# Patient Record
Sex: Female | Born: 1970
Health system: Southern US, Community
[De-identification: ages and names within clinical notes are randomized; demographics above are authoritative.]

## PROBLEM LIST (undated history)

## (undated) DIAGNOSIS — K219 Gastro-esophageal reflux disease without esophagitis: Secondary | ICD-10-CM

## (undated) DIAGNOSIS — D649 Anemia, unspecified: Secondary | ICD-10-CM

## (undated) DIAGNOSIS — E119 Type 2 diabetes mellitus without complications: Secondary | ICD-10-CM

## (undated) DIAGNOSIS — R011 Cardiac murmur, unspecified: Secondary | ICD-10-CM

## (undated) DIAGNOSIS — T7840XA Allergy, unspecified, initial encounter: Secondary | ICD-10-CM

## (undated) DIAGNOSIS — M1990 Unspecified osteoarthritis, unspecified site: Secondary | ICD-10-CM

## (undated) HISTORY — PX: OTHER SURGICAL HISTORY: SHX169

## (undated) HISTORY — PX: WISDOM TOOTH EXTRACTION: SHX21

## (undated) HISTORY — DX: Allergy, unspecified, initial encounter: T78.40XA

## (undated) HISTORY — DX: Anemia, unspecified: D64.9

## (undated) HISTORY — DX: Gastro-esophageal reflux disease without esophagitis: K21.9

## (undated) HISTORY — DX: Unspecified osteoarthritis, unspecified site: M19.90

## (undated) HISTORY — DX: Cardiac murmur, unspecified: R01.1

---

## 1997-08-10 ENCOUNTER — Other Ambulatory Visit: Admission: RE | Admit: 1997-08-10 | Discharge: 1997-08-10 | Payer: Self-pay | Admitting: *Deleted

## 1997-10-08 ENCOUNTER — Ambulatory Visit (HOSPITAL_COMMUNITY): Admission: RE | Admit: 1997-10-08 | Discharge: 1997-10-08 | Payer: Self-pay | Admitting: *Deleted

## 1997-11-05 ENCOUNTER — Ambulatory Visit (HOSPITAL_COMMUNITY): Admission: RE | Admit: 1997-11-05 | Discharge: 1997-11-05 | Payer: Self-pay | Admitting: *Deleted

## 1997-12-07 ENCOUNTER — Ambulatory Visit (HOSPITAL_COMMUNITY): Admission: RE | Admit: 1997-12-07 | Discharge: 1997-12-07 | Payer: Self-pay | Admitting: *Deleted

## 1997-12-22 ENCOUNTER — Inpatient Hospital Stay (HOSPITAL_COMMUNITY): Admission: AD | Admit: 1997-12-22 | Discharge: 1997-12-22 | Payer: Self-pay | Admitting: *Deleted

## 1998-02-26 ENCOUNTER — Encounter (HOSPITAL_COMMUNITY): Admission: RE | Admit: 1998-02-26 | Discharge: 1998-03-11 | Payer: Self-pay | Admitting: *Deleted

## 1998-03-05 ENCOUNTER — Encounter: Payer: Self-pay | Admitting: *Deleted

## 1998-03-09 ENCOUNTER — Inpatient Hospital Stay (HOSPITAL_COMMUNITY): Admission: AD | Admit: 1998-03-09 | Discharge: 1998-03-13 | Payer: Self-pay | Admitting: *Deleted

## 1998-03-10 ENCOUNTER — Encounter: Payer: Self-pay | Admitting: *Deleted

## 2000-05-12 ENCOUNTER — Emergency Department (HOSPITAL_COMMUNITY): Admission: EM | Admit: 2000-05-12 | Discharge: 2000-05-12 | Payer: Self-pay | Admitting: Emergency Medicine

## 2000-07-17 ENCOUNTER — Inpatient Hospital Stay (HOSPITAL_COMMUNITY): Admission: AD | Admit: 2000-07-17 | Discharge: 2000-07-17 | Payer: Self-pay | Admitting: *Deleted

## 2000-07-31 ENCOUNTER — Ambulatory Visit (HOSPITAL_COMMUNITY): Admission: RE | Admit: 2000-07-31 | Discharge: 2000-07-31 | Payer: Self-pay | Admitting: *Deleted

## 2000-07-31 ENCOUNTER — Encounter: Payer: Self-pay | Admitting: *Deleted

## 2000-08-29 ENCOUNTER — Inpatient Hospital Stay (HOSPITAL_COMMUNITY): Admission: AD | Admit: 2000-08-29 | Discharge: 2000-08-29 | Payer: Self-pay | Admitting: Obstetrics and Gynecology

## 2000-08-31 ENCOUNTER — Inpatient Hospital Stay (HOSPITAL_COMMUNITY): Admission: AD | Admit: 2000-08-31 | Discharge: 2000-09-03 | Payer: Self-pay | Admitting: Obstetrics and Gynecology

## 2000-08-31 ENCOUNTER — Encounter (INDEPENDENT_AMBULATORY_CARE_PROVIDER_SITE_OTHER): Payer: Self-pay

## 2000-10-03 ENCOUNTER — Other Ambulatory Visit: Admission: RE | Admit: 2000-10-03 | Discharge: 2000-10-03 | Payer: Self-pay | Admitting: Obstetrics and Gynecology

## 2001-11-07 ENCOUNTER — Emergency Department (HOSPITAL_COMMUNITY): Admission: EM | Admit: 2001-11-07 | Discharge: 2001-11-07 | Payer: Self-pay | Admitting: *Deleted

## 2002-03-31 ENCOUNTER — Other Ambulatory Visit: Admission: RE | Admit: 2002-03-31 | Discharge: 2002-03-31 | Payer: Self-pay | Admitting: Family Medicine

## 2002-08-01 ENCOUNTER — Encounter: Admission: RE | Admit: 2002-08-01 | Discharge: 2002-08-01 | Payer: Self-pay | Admitting: Family Medicine

## 2002-08-01 ENCOUNTER — Encounter: Payer: Self-pay | Admitting: Family Medicine

## 2002-11-24 ENCOUNTER — Emergency Department (HOSPITAL_COMMUNITY): Admission: AD | Admit: 2002-11-24 | Discharge: 2002-11-24 | Payer: Self-pay | Admitting: Family Medicine

## 2003-12-19 ENCOUNTER — Emergency Department (HOSPITAL_COMMUNITY): Admission: EM | Admit: 2003-12-19 | Discharge: 2003-12-19 | Payer: Self-pay | Admitting: Emergency Medicine

## 2005-01-06 ENCOUNTER — Emergency Department (HOSPITAL_COMMUNITY): Admission: EM | Admit: 2005-01-06 | Discharge: 2005-01-06 | Payer: Self-pay | Admitting: Emergency Medicine

## 2005-09-08 ENCOUNTER — Ambulatory Visit: Payer: Self-pay | Admitting: Family Medicine

## 2005-09-15 ENCOUNTER — Ambulatory Visit: Payer: Self-pay | Admitting: *Deleted

## 2005-12-11 ENCOUNTER — Ambulatory Visit: Payer: Self-pay | Admitting: Family Medicine

## 2006-04-11 ENCOUNTER — Ambulatory Visit: Payer: Self-pay | Admitting: Family Medicine

## 2006-07-12 ENCOUNTER — Ambulatory Visit: Payer: Self-pay | Admitting: Family Medicine

## 2006-10-03 ENCOUNTER — Encounter (INDEPENDENT_AMBULATORY_CARE_PROVIDER_SITE_OTHER): Payer: Self-pay | Admitting: *Deleted

## 2007-02-11 ENCOUNTER — Emergency Department (HOSPITAL_COMMUNITY): Admission: EM | Admit: 2007-02-11 | Discharge: 2007-02-11 | Payer: Self-pay | Admitting: Emergency Medicine

## 2007-10-04 ENCOUNTER — Emergency Department (HOSPITAL_COMMUNITY): Admission: EM | Admit: 2007-10-04 | Discharge: 2007-10-04 | Payer: Self-pay | Admitting: Family Medicine

## 2007-11-12 ENCOUNTER — Ambulatory Visit: Payer: Self-pay | Admitting: Family Medicine

## 2007-11-12 LAB — CONVERTED CEMR LAB
ALT: 9 units/L (ref 0–35)
AST: 12 units/L (ref 0–37)
BUN: 9 mg/dL (ref 6–23)
Calcium: 9.1 mg/dL (ref 8.4–10.5)
Creatinine, Ser: 0.58 mg/dL (ref 0.40–1.20)
HDL: 37 mg/dL — ABNORMAL LOW (ref >39)
Total Bilirubin: 0.3 mg/dL (ref 0.3–1.2)
Total CHOL/HDL Ratio: 4.9
VLDL: 32 mg/dL (ref 0–40)
Vit D, 1,25-Dihydroxy: 11 — ABNORMAL LOW (ref 30–89)

## 2008-02-13 ENCOUNTER — Ambulatory Visit: Payer: Self-pay | Admitting: Family Medicine

## 2008-02-20 ENCOUNTER — Ambulatory Visit (HOSPITAL_COMMUNITY): Admission: RE | Admit: 2008-02-20 | Discharge: 2008-02-20 | Payer: Self-pay | Admitting: Family Medicine

## 2008-08-04 ENCOUNTER — Ambulatory Visit: Payer: Self-pay | Admitting: Family Medicine

## 2009-02-10 ENCOUNTER — Ambulatory Visit: Payer: Self-pay | Admitting: Internal Medicine

## 2009-02-10 ENCOUNTER — Encounter (INDEPENDENT_AMBULATORY_CARE_PROVIDER_SITE_OTHER): Payer: Self-pay | Admitting: Family Medicine

## 2009-02-10 LAB — CONVERTED CEMR LAB
HDL: 50 mg/dL (ref >39)
Hgb A1c MFr Bld: 6.2 % — ABNORMAL HIGH (ref 4.6–6.1)
LDL Cholesterol: 121 mg/dL — ABNORMAL HIGH (ref 0–99)
Microalb, Ur: 2.74 mg/dL — ABNORMAL HIGH (ref 0.00–1.89)
Triglycerides: 89 mg/dL (ref <150)
VLDL: 18 mg/dL (ref 0–40)

## 2009-02-11 ENCOUNTER — Ambulatory Visit: Payer: Self-pay | Admitting: Internal Medicine

## 2009-07-20 ENCOUNTER — Emergency Department (HOSPITAL_BASED_OUTPATIENT_CLINIC_OR_DEPARTMENT_OTHER): Admission: EM | Admit: 2009-07-20 | Discharge: 2009-07-20 | Payer: Self-pay | Admitting: Emergency Medicine

## 2010-04-03 LAB — WET PREP, GENITAL: Trich, Wet Prep: NONE SEEN

## 2010-04-03 LAB — URINALYSIS, ROUTINE W REFLEX MICROSCOPIC
Bilirubin Urine: NEGATIVE
Glucose, UA: NEGATIVE mg/dL
Ketones, ur: NEGATIVE mg/dL
Protein, ur: NEGATIVE mg/dL
pH: 5.5 (ref 5.0–8.0)

## 2010-04-03 LAB — HERPES SIMPLEX VIRUS CULTURE: Culture: DETECTED

## 2010-04-03 LAB — GC/CHLAMYDIA PROBE AMP, GENITAL
Chlamydia, DNA Probe: NEGATIVE
GC Probe Amp, Genital: NEGATIVE

## 2010-06-03 NOTE — Op Note (Signed)
[facility]  Patient:    Meredith Nichols, Meredith Nichols                     MRN: [id number] Proc. Date: 08/31/00 Adm. Date:  [id number] Attending:  Wandalee Ferdinand                           Operative Report  PREOPERATIVE DIAGNOSES:       1. Intrauterine pregnancy at [redacted] weeks gestation.                               2. Previous cesarean section.                               3. Desire for attempt at permanent                                  sterilization.                               4. Anemia.  POSTOPERATIVE DIAGNOSES:      1. Intrauterine pregnancy at [redacted] weeks gestation.                               2. Meconium-stained amniotic fluid.  OPERATION:                    1. Repeat low transverse cesarean section.                               2. Bilateral tubal sterilization Barnett Abu).  SURGEON:                      Rudy Jew. Ashley Royalty, M.D.  ASSISTANT:                    Marcelle Overlie, M.D.  ANESTHESIA:                   Spinal.  FINDINGS:                     An 8 pound 5 ounce female, Apgars 8 at 1 minute and 9 at 5 minutes, sent to newborn nursery.  ESTIMATED BLOOD LOSS:         800 cc.  COMPLICATIONS:                None.  PACKS AND DRAINS:             Foley.  Sponge, needle, and instrument reported as correct x 2.  DESCRIPTION OF PROCEDURE:     The patient was taken to the operating room and placed in the sitting supine position.  After adequate spinal anesthesia was administered, she was placed in the dorsosupine position and prepped and draped in the usual manner for abdominal surgery.  Foley catheter was placed. A Pfannenstiel incision was made down to the level of the fascia.  The fascia was nicked with a knife and incised transversely with Mayo scissors.  The underlying rectus muscles were separated from the fascia using  sharp and blunt dissection.  The rectus muscles were separated in the midline exposing the peritoneum which was  elevated by hemostats and entered atraumatically with Metzenbaum scissors.  The incision was extended longitudinally.  The uterus was identified and a bladder flap created by incising the intrauterine serosa and sharply and bluntly dissecting the bladder inferior, held in place with a bladder blade.  The uterus was then entered through a low transverse incision using sharp and blunt dissection.  As the membranes were approached, they were noted to be discolored.  Amniotomy was performed and meconium encountered. The infants head was delivered, and DeLee suction was used to aspirate the oropharynx and nasopharynx.  The remainder of the infant was delivered after a loose nuchal cord was reduced.  The cord was triply clamped, cut, and the infant given to the awaiting pediatrics team.  Arterial cord pH was obtained from an isolated segment.  The ______ cord blood was obtained.  Placenta and membranes were removed in their entirely and submitted to pathology for histologic studies.  The uterus was exteriorized.  The uterus was then closed with two running layers of #1 Vicryl and reinforced with a running locking layer.  The second was a running, intermittently locking, and imbricating layer.  A couple of figure-of-eight sutures were required to obtain hemostasis.  Hemostasis was noted.  Next, attention was turned to the tubal sterilization procedure.  The left fallopian tube was grasped from the distal isthmic to the proximal ampullary segment.  An avascular area was chosen for ligation.  A #1 plain suture was placed around this segment of tube.  A second suture was placed.  The intervening portion of fallopian tube was excised with Metzenbaum scissors and submitted to pathology for histologic studies.  The right fallopian tube was grasped at the distal isthmic to the proximal ampullary portion.  A #1 plain catgut suture was placed on this isolated segment to ligate it.  A second suture was  placed as well.  The intervening portion of fallopian tube was incised with Metzenbaum scissors and submitted to pathology for histologic studies.  Hemostasis was noted bilaterally.  The uterus, tubes, and ovaries were found to be otherwise normal and were returned to the abdominal cavity. Copious irrigation was accomplished.  Hemostasis was noted.  The fascia was then closed with 0 Vicryl in a running fashion.  The skin was closed with staples.  At the conclusion of the procedure, the urine was clear and copious. DD:  08/31/00 TD:  09/01/00 Job: [id number]

## 2010-06-03 NOTE — Discharge Summary (Signed)
[facility]  Patient:    Meredith Nichols, Meredith Nichols Visit Number: [id number] MRN: [id number]          Service Type: OBS Location: 910A [id number] [id number] Attending Physician:  Wandalee Ferdinand Dictated by:   Rudy Jew Ashley Royalty, M.D. Admit Date:  08/31/2000 Discharge Date: 09/03/2000                             Discharge Summary  DISCHARGE DIAGNOSES:          1. Intrauterine pregnancy at term, delivered.                               2. Previous cesarean section.                               3. Desire for attempted permanent surgical                                  sterilization.                               4. Anemia.  OPERATIONS AND SPECIAL PROCEDURES:           1. Repeat low transverse cesarean section.                               2. Bilateral tubal sterilization procedure                                  Barnett Abu).  CONSULTATIONS:                None.  DISCHARGE MEDICATIONS:        1. Tylox one p.o. q.3-4h. p.r.n. pain.                               2. Chromagen one p.o. q.day.  HISTORY AND PHYSICAL:         This is a gravida 3, para 2, at [redacted] weeks gestation with aforementioned history. Hemoglobin on admission was 9.2. The patient was admitted for repeat cesarean section and tubal sterilization procedure.  HOSPITAL COURSE:              The patient was admitted to [facility]. Initial laboratory studies were drawn. On August 31, 2000 she was taken to the operating room and underwent repeat low transverse cesarean section and bilateral tubal sterilization procedure. The procedure was uncomplicated. It yielded an 8-pound 5-ounce female, Apgars 8 at one minute and 9 at five minutes, sent to newborn nursery. The patients postoperative course was complicated by the continued anemia. Hemoglobin on September 01, 2000 was 7.6. The hemoglobin stabilized and began rising on the second postoperative day; hence, the patient was felt to be stable  for discharge on the third postoperative day and was discharged home in satisfactory condition.  ACCESSORY CLINICAL FINDINGS:  Hemoglobin and hematocrit on admission were 9.2 and 28.0. Repeat values obtained on September 01, 2000 were 7.6 and 23.4, respectively. Repeat values obtained on September 02, 2000 were 7.8 and 24.3, respectively. Type and Rh revealed O positive blood. RPR was nonreactive.  DISPOSITION:                  The patient was to return to [facility] in four to six weeks for postpartum evaluation. Dictated by:   Rudy Jew Ashley Royalty, M.D. Attending Physician:  Wandalee Ferdinand DD:  09/20/00 TD:  09/20/00 Job: [id number]

## 2012-09-13 ENCOUNTER — Emergency Department (HOSPITAL_BASED_OUTPATIENT_CLINIC_OR_DEPARTMENT_OTHER)
Admission: EM | Admit: 2012-09-13 | Discharge: 2012-09-13 | Disposition: A | Discharge status: Home / Self Care | Payer: Self-pay | Attending: Emergency Medicine | Admitting: Emergency Medicine

## 2012-09-13 ENCOUNTER — Encounter (HOSPITAL_BASED_OUTPATIENT_CLINIC_OR_DEPARTMENT_OTHER): Payer: Self-pay | Admitting: *Deleted

## 2012-09-13 DIAGNOSIS — R21 Rash and other nonspecific skin eruption: Secondary | ICD-10-CM | POA: Insufficient documentation

## 2012-09-13 DIAGNOSIS — L089 Local infection of the skin and subcutaneous tissue, unspecified: Secondary | ICD-10-CM

## 2012-09-13 DIAGNOSIS — E119 Type 2 diabetes mellitus without complications: Secondary | ICD-10-CM | POA: Insufficient documentation

## 2012-09-13 DIAGNOSIS — I10 Essential (primary) hypertension: Secondary | ICD-10-CM | POA: Insufficient documentation

## 2012-09-13 HISTORY — DX: Type 2 diabetes mellitus without complications: E11.9

## 2012-09-13 MED ORDER — SULFAMETHOXAZOLE-TRIMETHOPRIM 800-160 MG PO TABS: 1.0000 | ORAL_TABLET | Freq: Two times a day (BID) | ORAL | Status: DC

## 2012-09-13 MED ORDER — CEFTRIAXONE SODIUM 1 G IJ SOLR
1.0000 g | Freq: Once | INTRAMUSCULAR | Status: AC
  Administered 2012-09-13: 1 g via INTRAMUSCULAR
  Filled 2012-09-13: qty 10

## 2012-09-13 NOTE — ED Provider Notes (Signed)
CSN: [id number]     Arrival date & time 09/13/12  1828 History   First MD Initiated Contact with Patient 09/13/12 1835     Chief Complaint  Patient presents with  . Cellulitis   (Consider location/radiation/quality/duration/timing/severity/associated sxs/prior Treatment) Patient is a 42 y.o. female presenting with rash. The history is provided by the patient. No language interpreter was used.  Rash Location:  Leg Leg rash location:  R leg Quality: painful, redness and swelling   Pain details:    Timing:  Constant   Progression:  Worsening   Past Medical History  Diagnosis Date  . Diabetes mellitus without complication   . Hypertension    Past Surgical History  Procedure Laterality Date  . C sections     No family history on file. History  Substance Use Topics  . Smoking status: Never Smoker   . Smokeless tobacco: Not on file  . Alcohol Use: No   OB History   Grav Para Term Preterm Abortions TAB SAB Ect Mult Living                 Review of Systems  Skin: Positive for rash and wound.  All other systems reviewed and are negative.    Allergies  Review of patient's allergies indicates no known allergies.  Home Medications  No current outpatient prescriptions on file. BP 130/92  Pulse 116  Temp(Src) 98.2 F (36.8 C) (Oral)  Resp 20  SpO2 100% Physical Exam  Nursing note and vitals reviewed. Constitutional: She is oriented to person, place, and time. She appears well-developed and well-nourished.  HENT:  Head: Normocephalic.  Musculoskeletal: She exhibits tenderness.  Red swollen area right lower leg,  3 small pustules in center  Neurological: She is alert and oriented to person, place, and time. She has normal reflexes.  Skin: There is erythema.  Psychiatric: She has a normal mood and affect.    ED Course  Procedures (including critical care time) Labs Review Labs Reviewed - No data to display Imaging Review No results found.  MDM  No diagnosis  found. Pt given injection of Rocephin,  Rx for Bactrim  Pt counseled on soaking and wound improvement vs increasing infection    Elson Areas, PA-C 09/13/12 1923

## 2012-09-13 NOTE — ED Provider Notes (Signed)
Medical screening examination/treatment/procedure(s) were performed by non-physician practitioner and as supervising physician I was immediately available for consultation/collaboration.   Vester Balthazor, MD 09/13/12 1927 

## 2012-09-13 NOTE — ED Notes (Signed)
Bug bites to the back of her right lower leg. Redness, swelling, hot to touch and pain.

## 2014-02-26 ENCOUNTER — Ambulatory Visit: Payer: Self-pay

## 2017-02-05 ENCOUNTER — Encounter: Payer: Self-pay | Admitting: Nurse Practitioner

## 2017-02-05 ENCOUNTER — Ambulatory Visit: Payer: Self-pay | Attending: Nurse Practitioner | Admitting: Nurse Practitioner

## 2017-02-05 ENCOUNTER — Ambulatory Visit: Payer: Self-pay

## 2017-02-05 VITALS — BP 123/81 | HR 73 | Temp 98.2°F | Ht 67.0 in | Wt 221.4 lb

## 2017-02-05 DIAGNOSIS — Z6830 Body mass index (BMI) 30.0-30.9, adult: Secondary | ICD-10-CM | POA: Insufficient documentation

## 2017-02-05 DIAGNOSIS — M25561 Pain in right knee: Secondary | ICD-10-CM | POA: Insufficient documentation

## 2017-02-05 DIAGNOSIS — G8929 Other chronic pain: Secondary | ICD-10-CM | POA: Insufficient documentation

## 2017-02-05 DIAGNOSIS — E785 Hyperlipidemia, unspecified: Secondary | ICD-10-CM | POA: Insufficient documentation

## 2017-02-05 DIAGNOSIS — E669 Obesity, unspecified: Secondary | ICD-10-CM | POA: Insufficient documentation

## 2017-02-05 DIAGNOSIS — Z8639 Personal history of other endocrine, nutritional and metabolic disease: Secondary | ICD-10-CM | POA: Insufficient documentation

## 2017-02-05 DIAGNOSIS — I10 Essential (primary) hypertension: Secondary | ICD-10-CM | POA: Insufficient documentation

## 2017-02-05 DIAGNOSIS — M7989 Other specified soft tissue disorders: Secondary | ICD-10-CM | POA: Insufficient documentation

## 2017-02-05 DIAGNOSIS — E119 Type 2 diabetes mellitus without complications: Secondary | ICD-10-CM | POA: Insufficient documentation

## 2017-02-05 DIAGNOSIS — Z0000 Encounter for general adult medical examination without abnormal findings: Secondary | ICD-10-CM | POA: Insufficient documentation

## 2017-02-05 MED ORDER — MELOXICAM 7.5 MG PO TABS: 7.5000 mg | ORAL_TABLET | Freq: Every day | ORAL | 1 refills | Status: DC

## 2017-02-05 MED FILL — MELOXICAM 7.5 MG TABLET: 7.5 | 30 days supply | Qty: 30 | Fill #0

## 2017-02-05 NOTE — Patient Instructions (Addendum)

## 2017-02-05 NOTE — Progress Notes (Signed)
Assessment & Plan:  Meredith Nichols was seen today for new patient (initial visit).  Diagnoses and all orders for this visit:  Chronic pain of right knee -     meloxicam (MOBIC) 7.5 MG tablet; Take 1 tablet (7.5 mg total) by mouth daily. -     VITAMIN D 25 Hydroxy (Vit-D Deficiency, Fractures) May alternate with acetaminophen for pain relief Apply ice to affected area for reduction of swelling Elevated extremity as much as possible to reduce swelling Wear a knee brace when mobile   Obesity (BMI 30-39.9) Discussed diet and exercise for person with BMI >25. Instructed: You must burn more calories than you eat. Losing 5 percent of your body weight should be considered a success. In the longer term, losing more than 15 percent of your body weight and staying at this weight is an extremely good result. However, keep in mind that even losing 5 percent of your body weight leads to important health benefits, so try not to get discouraged if you're not able to lose more than this. Will recheck weight in 3-6 months.  Routine adult health maintenance -     CBC -     Basic metabolic panel -     Lipid panel  History of diabetes mellitus, type II -     Hemoglobin A1c -     Microalbumin / creatinine urine ratio    Patient has been counseled on age-appropriate routine health concerns for screening and prevention. These are reviewed and up-to-date. Referrals have been placed accordingly. Immunizations are up-to-date or declined.    Subjective:   Chief Complaint  Patient presents with  . New Patient (Initial Visit)    Patient is here as a new patient and stated her knee keeps on popping out. Patient stated her body aches if she sit too long.    HPI Meredith Nichols 47 y.o. female presents to office today to establish care. She has been receiving primary care through [facility] in [place]. Her last physical exam was within the past year. She is due for mammogram and PAP smear. REFERRAL MADE TO  [facility] for Mammogram.   Diabetes Mellitus She was diagnosed with diabetes about 6 years ago. Took lisinopril and another medication she can not recall for diabetes in the past. She does not remember her A1c. States she was taken off both medications after losing over 70lbs. She has managed to keep the weight off. Will check A1c today.      Hyperlipidemia She was taking a statin in the past and reports she was taken off of this medication as well after her weight loss. Lipid panel pending.   Knee Pain Onset several years ago with worsening over the past year. She feels a "pop" in her knee with stair climbing or flexion/bending of the left knee. Pain lasts several  minutes. Relieved by nothing: goes away on its own. She denies any major trauma or injury to her knee. She has fallen a few times in the past and landed on her left knee but no injury that required follow up. She also endorses intermittent swelling of the knee. She has not taken any medication for the pain or swelling.       Review of Systems  Constitutional: Negative for fever, malaise/fatigue and weight loss.  HENT: Negative.  Negative for nosebleeds.   Eyes: Negative.  Negative for blurred vision, double vision and photophobia.  Respiratory: Negative.  Negative for cough and shortness of breath.  Cardiovascular: Negative.  Negative for chest pain, palpitations and leg swelling.  Gastrointestinal: Negative.  Negative for abdominal pain, constipation, diarrhea, heartburn, nausea and vomiting.  Musculoskeletal: Positive for falls (in the past) and joint pain. Negative for back pain, myalgias and neck pain.  Neurological: Negative.  Negative for dizziness, focal weakness, seizures and headaches.  Endo/Heme/Allergies: Negative for environmental allergies.  Psychiatric/Behavioral: Negative.  Negative for suicidal ideas.    Past Medical History:  Diagnosis Date  . Diabetes mellitus without complication ([place])   . Hypertension      Past Surgical History:  Procedure Laterality Date  . c sections      Family History  Problem Relation Age of Onset  . Hypertension Mother     Social History Reviewed with no changes to be made today.   Outpatient Medications Prior to Visit  Medication Sig Dispense Refill  . sulfamethoxazole-trimethoprim (SEPTRA DS) 800-160 MG per tablet Take 1 tablet by mouth 2 (two) times daily. (Patient not taking: Reported on 02/05/2017) 20 tablet 0   No facility-administered medications prior to visit.     Allergies  Allergen Reactions  . Tomato Flavor [Flavoring Agent]     Lip swell up       Objective:    BP 123/81 (BP Location: Left Arm, Patient Position: Sitting, Cuff Size: Normal)   Pulse 73   Temp 98.2 F (36.8 C) (Oral)   Ht 5\' 7"  (1.702 m)   Wt 221 lb 6.4 oz (100.4 kg)   SpO2 100%   BMI 34.68 kg/m  Wt Readings from Last 3 Encounters:  02/05/17 221 lb 6.4 oz (100.4 kg)    Physical Exam  Constitutional: She is oriented to person, place, and time. She appears well-developed and well-nourished. She is cooperative.  HENT:  Head: Normocephalic and atraumatic.  Eyes: EOM are normal.  Neck: Normal range of motion.  Cardiovascular: Normal rate, regular rhythm and intact distal pulses. Exam reveals no gallop and no friction rub.  Murmur heard. Pulmonary/Chest: Effort normal and breath sounds normal. No tachypnea. No respiratory distress. She has no decreased breath sounds. She has no wheezes. She has no rhonchi. She has no rales. She exhibits no tenderness.  Abdominal: Soft. Bowel sounds are normal.  Musculoskeletal: Normal range of motion. She exhibits no edema.  Neurological: She is alert and oriented to person, place, and time. Coordination normal.  Skin: Skin is warm and dry.  Psychiatric: She has a normal mood and affect. Her behavior is normal. Judgment and thought content normal.  Nursing note and vitals reviewed.      Patient has been counseled extensively  about nutrition and exercise as well as the importance of adherence with medications and regular follow-up. The patient was given clear instructions to go to ER or return to medical center if symptoms don't improve, worsen or new problems develop. The patient verbalized understanding.   Follow-up: Return for Needs appointment with financial representative.. FOLLOW UP FOR PAP  Gildardo Pounds, FNP-BC [facility] and [facility], [place]   02/05/2017, 9:31 AM

## 2017-02-06 LAB — CBC
HEMATOCRIT: 24.2 % — AB (ref 34.0–46.6)
HEMOGLOBIN: 6.2 g/dL — AB (ref 11.1–15.9)
MCH: 17.3 pg — AB (ref 26.6–33.0)
MCHC: 25.6 g/dL — AB (ref 31.5–35.7)
MCV: 67 fL — AB (ref 79–97)
Platelets: 450 10*3/uL — ABNORMAL HIGH (ref 150–379)
RBC: 3.59 x10E6/uL — AB (ref 3.77–5.28)
RDW: 19.6 % — ABNORMAL HIGH (ref 12.3–15.4)
WBC: 5 10*3/uL (ref 3.4–10.8)

## 2017-02-06 LAB — MICROALBUMIN / CREATININE URINE RATIO
Creatinine, Urine: 103.9 mg/dL
MICROALB/CREAT RATIO: 40.9 mg/g{creat} — AB (ref 0.0–30.0)
Microalbumin, Urine: 42.5 ug/mL

## 2017-02-06 LAB — HEMOGLOBIN A1C
ESTIMATED AVERAGE GLUCOSE: 108 mg/dL
HEMOGLOBIN A1C: 5.4 % (ref 4.8–5.6)

## 2017-02-06 LAB — BASIC METABOLIC PANEL
BUN / CREAT RATIO: 14 (ref 9–23)
BUN: 8 mg/dL (ref 6–24)
CO2: 24 mmol/L (ref 20–29)
CREATININE: 0.56 mg/dL — AB (ref 0.57–1.00)
Calcium: 9.2 mg/dL (ref 8.7–10.2)
Chloride: 104 mmol/L (ref 96–106)
GFR, EST AFRICAN AMERICAN: 128 mL/min/{1.73_m2} (ref >59)
GFR, EST NON AFRICAN AMERICAN: 111 mL/min/{1.73_m2} (ref >59)
GLUCOSE: 101 mg/dL — AB (ref 65–99)
Potassium: 4 mmol/L (ref 3.5–5.2)
SODIUM: 139 mmol/L (ref 134–144)

## 2017-02-06 LAB — LIPID PANEL
Chol/HDL Ratio: 2.8 ratio (ref 0.0–4.4)
Cholesterol, Total: 147 mg/dL (ref 100–199)
HDL: 52 mg/dL (ref >39)
LDL CALC: 82 mg/dL (ref 0–99)
Triglycerides: 65 mg/dL (ref 0–149)
VLDL CHOLESTEROL CAL: 13 mg/dL (ref 5–40)

## 2017-02-06 LAB — VITAMIN D 25 HYDROXY (VIT D DEFICIENCY, FRACTURES): VIT D 25 HYDROXY: 14 ng/mL — AB (ref 30.0–100.0)

## 2017-02-07 ENCOUNTER — Telehealth: Payer: Self-pay | Admitting: Nurse Practitioner

## 2017-02-07 ENCOUNTER — Other Ambulatory Visit: Payer: Self-pay | Admitting: Obstetrics and Gynecology

## 2017-02-07 ENCOUNTER — Ambulatory Visit: Payer: Self-pay | Attending: Nurse Practitioner

## 2017-02-07 ENCOUNTER — Other Ambulatory Visit: Payer: Self-pay | Admitting: Nurse Practitioner

## 2017-02-07 DIAGNOSIS — R7989 Other specified abnormal findings of blood chemistry: Secondary | ICD-10-CM

## 2017-02-07 DIAGNOSIS — Z1231 Encounter for screening mammogram for malignant neoplasm of breast: Secondary | ICD-10-CM

## 2017-02-07 NOTE — Telephone Encounter (Signed)
-----   Message from Gildardo Pounds, NP sent at 02/07/2017  8:40 AM EST ----- Please call patient. She has a critically low H/H. I need her to come in to the office for another lab draw to have it repeated as soon as possible. Does she have a history of iron deficiency anemia. I have attempted to call her as well this morning and left a voice mail on her mobile number.

## 2017-02-07 NOTE — Telephone Encounter (Signed)
CMA attempt to call patient to have her come in to get lab draw as soon as possible.   Patient did not answer and left a voicemail for patient to call back.

## 2017-02-07 NOTE — Telephone Encounter (Signed)
Patient came in person to get her blood work done.  Patient stated she do have a history of anemia.

## 2017-02-07 NOTE — Telephone Encounter (Signed)
Patient was informed of lab results. Patient stated she DOES have a history or anemia.

## 2017-02-07 NOTE — Addendum Note (Signed)
Addended by: Octaviano Glow on: 02/07/2017 04:29 PM   Modules accepted: Orders

## 2017-02-07 NOTE — Progress Notes (Signed)
Patient here for lab visit only 

## 2017-02-08 LAB — CBC
HEMATOCRIT: 23.1 % — AB (ref 34.0–46.6)
HEMOGLOBIN: 6.2 g/dL — AB (ref 11.1–15.9)
MCH: 18 pg — AB (ref 26.6–33.0)
MCHC: 26.8 g/dL — AB (ref 31.5–35.7)
MCV: 67 fL — AB (ref 79–97)
Platelets: 426 10*3/uL — ABNORMAL HIGH (ref 150–379)
RBC: 3.45 x10E6/uL — ABNORMAL LOW (ref 3.77–5.28)
RDW: 19.6 % — AB (ref 12.3–15.4)
WBC: 6.3 10*3/uL (ref 3.4–10.8)

## 2017-02-12 ENCOUNTER — Encounter: Payer: Self-pay | Admitting: Nurse Practitioner

## 2017-02-12 ENCOUNTER — Other Ambulatory Visit: Payer: Self-pay | Admitting: Nurse Practitioner

## 2017-02-12 ENCOUNTER — Ambulatory Visit: Payer: Self-pay | Attending: Nurse Practitioner | Admitting: Nurse Practitioner

## 2017-02-12 VITALS — BP 132/81 | HR 91 | Temp 98.2°F | Resp 12 | Ht 67.0 in | Wt 223.0 lb

## 2017-02-12 DIAGNOSIS — Z8249 Family history of ischemic heart disease and other diseases of the circulatory system: Secondary | ICD-10-CM | POA: Insufficient documentation

## 2017-02-12 DIAGNOSIS — E119 Type 2 diabetes mellitus without complications: Secondary | ICD-10-CM | POA: Insufficient documentation

## 2017-02-12 DIAGNOSIS — I10 Essential (primary) hypertension: Secondary | ICD-10-CM | POA: Insufficient documentation

## 2017-02-12 DIAGNOSIS — Z791 Long term (current) use of non-steroidal anti-inflammatories (NSAID): Secondary | ICD-10-CM | POA: Insufficient documentation

## 2017-02-12 DIAGNOSIS — D508 Other iron deficiency anemias: Secondary | ICD-10-CM

## 2017-02-12 DIAGNOSIS — Z124 Encounter for screening for malignant neoplasm of cervix: Secondary | ICD-10-CM | POA: Insufficient documentation

## 2017-02-12 DIAGNOSIS — Z9102 Food additives allergy status: Secondary | ICD-10-CM | POA: Insufficient documentation

## 2017-02-12 DIAGNOSIS — Z9889 Other specified postprocedural states: Secondary | ICD-10-CM | POA: Insufficient documentation

## 2017-02-12 NOTE — Progress Notes (Signed)
Assessment & Plan:  Meredith Nichols was seen today for gynecologic exam.  Diagnoses and all orders for this visit:  Pap smear for cervical cancer screening -     Cytology - PAP -     Cervicovaginal ancillary only    Patient has been counseled on age-appropriate routine health concerns for screening and prevention. These are reviewed and up-to-date. Referrals have been placed accordingly. Immunizations are up-to-date or declined.    Subjective:   Chief Complaint  Patient presents with  . Gynecologic Exam    Patient is here for a pap.    HPI Meredith Nichols 47 y.o. female presents to office today for pap. She had an abnormal pap smear in 2007 with negative biopsy. Pap smears thereafter she reports have been negative.     Review of Systems  Constitutional: Negative.  Negative for chills, fever, malaise/fatigue and weight loss.  Respiratory: Negative.  Negative for cough, sputum production and wheezing.   Cardiovascular: Negative.  Negative for chest pain, palpitations, orthopnea and claudication.  Genitourinary: Negative.  Negative for dysuria, flank pain, frequency, hematuria and urgency.  Neurological: Negative.  Negative for dizziness, tingling, sensory change, focal weakness and headaches.  Psychiatric/Behavioral: Negative.     Past Medical History:  Diagnosis Date  . Diabetes mellitus without complication ([place])   . Hypertension     Past Surgical History:  Procedure Laterality Date  . c sections      Family History  Problem Relation Age of Onset  . Hypertension Mother     Social History Reviewed with no changes to be made today.   Outpatient Medications Prior to Visit  Medication Sig Dispense Refill  . meloxicam (MOBIC) 7.5 MG tablet Take 1 tablet (7.5 mg total) by mouth daily. 30 tablet 1  . sulfamethoxazole-trimethoprim (SEPTRA DS) 800-160 MG per tablet Take 1 tablet by mouth 2 (two) times daily. (Patient not taking: Reported on 02/05/2017) 20 tablet 0   No  facility-administered medications prior to visit.     Allergies  Allergen Reactions  . Tomato Flavor [Flavoring Agent]     Lip swell up       Objective:    BP 132/81 (BP Location: Right Arm, Patient Position: Sitting, Cuff Size: Normal)   Pulse 91   Temp 98.2 F (36.8 C) (Oral)   Resp 12   Ht 5\' 7"  (1.702 m)   Wt 223 lb (101.2 kg)   SpO2 100%   BMI 34.93 kg/m  Wt Readings from Last 3 Encounters:  02/12/17 223 lb (101.2 kg)  02/05/17 221 lb 6.4 oz (100.4 kg)    Physical Exam  Constitutional: She is oriented to person, place, and time. She appears well-developed and well-nourished.  HENT:  Head: Normocephalic.  Cardiovascular: Normal rate, regular rhythm and normal heart sounds.  Pulmonary/Chest: Effort normal and breath sounds normal. No respiratory distress. She has no wheezes. She has no rales. She exhibits no tenderness.  Abdominal: Soft. Bowel sounds are normal.  Genitourinary: No labial fusion. There is no rash, tenderness, lesion or injury on the right labia. There is no rash, tenderness, lesion or injury on the left labia. Uterus is deviated. Uterus is not enlarged, not fixed and not tender. Cervix exhibits discharge. Cervix exhibits no motion tenderness and no friability. Right adnexum displays no mass, no tenderness and no fullness. Left adnexum displays no mass, no tenderness and no fullness. No erythema, tenderness or bleeding in the vagina. No foreign body in the vagina. No signs of injury around  the vagina. Vaginal discharge found.  Neurological: She is alert and oriented to person, place, and time.  Psychiatric: She has a normal mood and affect. Her behavior is normal. Judgment and thought content normal.       Patient has been counseled extensively about nutrition and exercise as well as the importance of adherence with medications and regular follow-up. The patient was given clear instructions to go to ER or return to medical center if symptoms don't improve,  worsen or new problems develop. The patient verbalized understanding.   Follow-up: Return if symptoms worsen or fail to improve.   Gildardo Pounds, FNP-BC [facility] and [place], [place]   02/13/2017, 10:38 PM

## 2017-02-13 ENCOUNTER — Telehealth: Payer: Self-pay

## 2017-02-13 ENCOUNTER — Encounter: Payer: Self-pay | Admitting: Nurse Practitioner

## 2017-02-13 NOTE — Telephone Encounter (Signed)
-----   Message from Gildardo Pounds, NP sent at 02/12/2017  1:05 AM EST ----- Labs still show iron deficiency. Please make sure you schedule with the financial representative so that we can refer you to GYN and gastroenterology for additional work up. I would like for you to take iron tablets (ferrous sulfate) 325 mg every other day for the next 3 months. You can take it with orange juice so that it will decrease and stomach upset and it also helps with absorption. Will order additional labs at your next office visit.

## 2017-02-13 NOTE — Telephone Encounter (Signed)
CMA informed patient regarding lab result and PCP advising in person.     Patient understood.

## 2017-02-14 ENCOUNTER — Encounter: Payer: Self-pay | Admitting: Obstetrics & Gynecology

## 2017-02-14 LAB — CERVICOVAGINAL ANCILLARY ONLY
Bacterial vaginitis: POSITIVE — AB
CHLAMYDIA, DNA PROBE: NEGATIVE
Candida vaginitis: NEGATIVE
Neisseria Gonorrhea: NEGATIVE
TRICH (WINDOWPATH): NEGATIVE

## 2017-02-15 ENCOUNTER — Other Ambulatory Visit: Payer: Self-pay | Admitting: Nurse Practitioner

## 2017-02-15 ENCOUNTER — Telehealth: Payer: Self-pay

## 2017-02-15 LAB — CYTOLOGY - PAP
DIAGNOSIS: NEGATIVE
HPV (WINDOPATH): NOT DETECTED

## 2017-02-15 MED ORDER — METRONIDAZOLE 500 MG PO TABS: 500.0000 mg | ORAL_TABLET | Freq: Two times a day (BID) | ORAL | 0 refills | Status: AC

## 2017-02-15 MED FILL — metroNIDAZOLE 500 MG TABS: 500 | 7 days supply | Qty: 14 | Fill #0

## 2017-02-15 NOTE — Telephone Encounter (Signed)
-----   Message from Gildardo Pounds, NP sent at 02/15/2017  1:52 AM EST ----- Wet prep was positive for Bacterial vaginosis. Flagyl has been sent to the pharmacy

## 2017-02-15 NOTE — Telephone Encounter (Signed)
CMA called patient to inform on lab result.  Patient understood and aware to pick up Rx.

## 2017-02-16 ENCOUNTER — Telehealth: Payer: Self-pay

## 2017-02-16 NOTE — Telephone Encounter (Signed)
-----   Message from Gildardo Pounds, NP sent at 02/15/2017  8:51 PM EST ----- PAP smear was normal. Next PAP due in 2022

## 2017-02-16 NOTE — Telephone Encounter (Signed)
CMA attempt to call patient regarding results.  Patient did not answer and CMA left a VM/callback number.   If patient do call, please let her know:  !!PAP smear was normal. Next PAP due in 2022!!

## 2017-02-20 ENCOUNTER — Encounter: Payer: Self-pay | Admitting: Internal Medicine

## 2017-03-06 ENCOUNTER — Encounter (HOSPITAL_COMMUNITY): Payer: Self-pay

## 2017-03-06 ENCOUNTER — Ambulatory Visit (HOSPITAL_COMMUNITY)
Admission: RE | Admit: 2017-03-06 | Discharge: 2017-03-06 | Disposition: A | Discharge status: Home / Self Care | Payer: Self-pay | Source: Ambulatory Visit | Attending: Obstetrics and Gynecology | Admitting: Obstetrics and Gynecology

## 2017-03-06 ENCOUNTER — Ambulatory Visit
Admission: RE | Admit: 2017-03-06 | Discharge: 2017-03-06 | Disposition: A | Discharge status: Home / Self Care | Payer: No Typology Code available for payment source | Source: Ambulatory Visit | Attending: Obstetrics and Gynecology | Admitting: Obstetrics and Gynecology

## 2017-03-06 VITALS — BP 110/70 | Ht 67.0 in | Wt 222.0 lb

## 2017-03-06 DIAGNOSIS — Z1239 Encounter for other screening for malignant neoplasm of breast: Secondary | ICD-10-CM

## 2017-03-06 DIAGNOSIS — Z1231 Encounter for screening mammogram for malignant neoplasm of breast: Secondary | ICD-10-CM

## 2017-03-06 NOTE — Progress Notes (Signed)
No complaints today.   Pap Smear: Pap smear not completed today. Last Pap smear was 02/12/2017 at [facility] and normal with negative HPV. Per patient has a history of one abnormal Pap smear between 2005-2007 that a colposcopy was completed for follow-up. Patient states she has only had two normal Pap smears since colposcopy and that includes her last Pap smear. Last Pap smear result is in Epic.  Physical exam: Breasts Breasts symmetrical. No skin abnormalities bilateral breasts. No nipple retraction bilateral breasts. No nipple discharge bilateral breasts. No lymphadenopathy. No lumps palpated bilateral breasts. No complaints of pain or tenderness on exam. Referred patient to the [facility] for a screening mammogram. Appointment scheduled for Tuesday, March 06, 2017 at 1620.        Pelvic/Bimanual No Pap smear completed today since last Pap smear and HPV typing was 02/12/2017. Pap smear not indicated per BCCCP guidelines.   Smoking History: Patient has never smoked.  Patient Navigation: Patient education provided. Access to services provided for patient through BCCCP program.   Breast and Cervical Cancer Risk Assessment: Patient has no family history of breast cancer, known genetic mutations, or radiation treatment to the chest before age 77. Patient has a history of cervical dysplasia. Patient has no history of being immunocompromised, or DES exposure in-utero.

## 2017-03-06 NOTE — Patient Instructions (Signed)
Explained breast self awareness with Meredith Nichols. Patient did not need a Pap smear today due to last Pap smear and HPV typing was 02/12/2017. Let her know that her next Pap smear will be due in one year since she has had only two normal Pap smears since she had a colposcopy completed. Referred patient to the [facility] for a screening mammogram. Appointment scheduled for Tuesday, March 06, 2017 at 1620. Patient aware of appointment and will be there. Let [facility] know [facility] will follow up with her within the next couple weeks with results of mammogram by letter or phone. [name] verbalized understanding.  Vitor Overbaugh, Arvil Chaco, RN 3:35 PM

## 2017-03-09 ENCOUNTER — Encounter: Payer: Self-pay | Admitting: Obstetrics & Gynecology

## 2017-03-15 MED FILL — MELOXICAM 7.5 MG TABLET: 7.5 | 30 days supply | Qty: 30 | Fill #1

## 2017-04-13 ENCOUNTER — Telehealth: Payer: Self-pay | Admitting: Gastroenterology

## 2017-04-13 ENCOUNTER — Encounter: Payer: Self-pay | Admitting: Internal Medicine

## 2017-04-13 ENCOUNTER — Ambulatory Visit: Payer: Self-pay | Admitting: Gastroenterology

## 2017-04-13 NOTE — Telephone Encounter (Signed)
Patient was a no show and letter sent  °

## 2017-05-14 ENCOUNTER — Ambulatory Visit: Payer: Self-pay | Admitting: Nurse Practitioner

## 2017-05-16 ENCOUNTER — Ambulatory Visit: Payer: Self-pay | Attending: Nurse Practitioner

## 2017-06-17 ENCOUNTER — Emergency Department (HOSPITAL_COMMUNITY)
Admission: EM | Admit: 2017-06-17 | Discharge: 2017-06-17 | Disposition: A | Discharge status: Home / Self Care | Payer: No Typology Code available for payment source | Attending: Emergency Medicine | Admitting: Emergency Medicine

## 2017-06-17 ENCOUNTER — Encounter (HOSPITAL_COMMUNITY): Payer: Self-pay | Admitting: Emergency Medicine

## 2017-06-17 ENCOUNTER — Emergency Department (HOSPITAL_COMMUNITY): Payer: No Typology Code available for payment source

## 2017-06-17 DIAGNOSIS — Y92410 Unspecified street and highway as the place of occurrence of the external cause: Secondary | ICD-10-CM | POA: Insufficient documentation

## 2017-06-17 DIAGNOSIS — Y998 Other external cause status: Secondary | ICD-10-CM | POA: Insufficient documentation

## 2017-06-17 DIAGNOSIS — Y939 Activity, unspecified: Secondary | ICD-10-CM | POA: Insufficient documentation

## 2017-06-17 DIAGNOSIS — M542 Cervicalgia: Secondary | ICD-10-CM | POA: Diagnosis present

## 2017-06-17 DIAGNOSIS — E119 Type 2 diabetes mellitus without complications: Secondary | ICD-10-CM | POA: Insufficient documentation

## 2017-06-17 DIAGNOSIS — Z79899 Other long term (current) drug therapy: Secondary | ICD-10-CM | POA: Insufficient documentation

## 2017-06-17 DIAGNOSIS — M79605 Pain in left leg: Secondary | ICD-10-CM | POA: Insufficient documentation

## 2017-06-17 DIAGNOSIS — M549 Dorsalgia, unspecified: Secondary | ICD-10-CM | POA: Diagnosis not present

## 2017-06-17 LAB — POC URINE PREG, ED: PREG TEST UR: NEGATIVE

## 2017-06-17 MED ORDER — NAPROXEN 500 MG PO TABS: 500.0000 mg | ORAL_TABLET | Freq: Two times a day (BID) | ORAL | 0 refills | Status: DC

## 2017-06-17 MED ORDER — NAPROXEN 250 MG PO TABS
500.0000 mg | ORAL_TABLET | Freq: Once | ORAL | Status: AC
  Administered 2017-06-17: 500 mg via ORAL
  Filled 2017-06-17: qty 2

## 2017-06-17 MED ORDER — METHOCARBAMOL 500 MG PO TABS: 500.0000 mg | ORAL_TABLET | Freq: Three times a day (TID) | ORAL | 0 refills | Status: DC | PRN
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
## 2017-06-17 NOTE — ED Notes (Signed)
Family at bedside. 
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
## 2017-06-19 MED FILL — METHOCARBAMOL 500 MG TABS: 500 | 10 days supply | Qty: 30 | Fill #0

## 2017-06-19 MED FILL — NAPROXEN 500 MG TABLET: 500 | 15 days supply | Qty: 30 | Fill #0

## 2017-06-29 ENCOUNTER — Ambulatory Visit: Payer: No Typology Code available for payment source | Admitting: Nurse Practitioner

## 2017-07-02 ENCOUNTER — Ambulatory Visit: Payer: Self-pay | Attending: Nurse Practitioner | Admitting: Nurse Practitioner

## 2017-07-02 ENCOUNTER — Encounter: Payer: Self-pay | Admitting: Nurse Practitioner

## 2017-07-02 VITALS — BP 113/74 | HR 82 | Temp 99.2°F | Ht 67.0 in | Wt 230.6 lb

## 2017-07-02 DIAGNOSIS — Z841 Family history of disorders of kidney and ureter: Secondary | ICD-10-CM | POA: Insufficient documentation

## 2017-07-02 DIAGNOSIS — M791 Myalgia, unspecified site: Secondary | ICD-10-CM | POA: Insufficient documentation

## 2017-07-02 DIAGNOSIS — N920 Excessive and frequent menstruation with regular cycle: Secondary | ICD-10-CM | POA: Insufficient documentation

## 2017-07-02 DIAGNOSIS — Z8249 Family history of ischemic heart disease and other diseases of the circulatory system: Secondary | ICD-10-CM | POA: Insufficient documentation

## 2017-07-02 DIAGNOSIS — D500 Iron deficiency anemia secondary to blood loss (chronic): Secondary | ICD-10-CM | POA: Insufficient documentation

## 2017-07-02 DIAGNOSIS — Z833 Family history of diabetes mellitus: Secondary | ICD-10-CM | POA: Insufficient documentation

## 2017-07-02 DIAGNOSIS — T148XXA Other injury of unspecified body region, initial encounter: Secondary | ICD-10-CM

## 2017-07-02 NOTE — Patient Instructions (Signed)
Anemia Anemia is a condition in which you do not have enough red blood cells or hemoglobin. Hemoglobin is a substance in red blood cells that carries oxygen. When you do not have enough red blood cells or hemoglobin (are anemic), your body cannot get enough oxygen and your organs may not work properly. As a result, you may feel very tired or have other problems. What are the causes? Common causes of anemia include:  Excessive bleeding. Anemia can be caused by excessive bleeding inside or outside the body, including bleeding from the intestine or from periods in women.  Poor nutrition.  Long-lasting (chronic) kidney, thyroid, and liver disease.  Bone marrow disorders.  Cancer and treatments for cancer.  HIV (human immunodeficiency virus) and AIDS (acquired immunodeficiency syndrome).  Treatments for HIV and AIDS.  Spleen problems.  Blood disorders.  Infections, medicines, and autoimmune disorders that destroy red blood cells.  What are the signs or symptoms? Symptoms of this condition include:  Minor weakness.  Dizziness.  Headache.  Feeling heartbeats that are irregular or faster than normal (palpitations).  Shortness of breath, especially with exercise.  Paleness.  Cold sensitivity.  Indigestion.  Nausea.  Difficulty sleeping.  Difficulty concentrating.  Symptoms may occur suddenly or develop slowly. If your anemia is mild, you may not have symptoms. How is this diagnosed? This condition is diagnosed based on:  Blood tests.  Your medical history.  A physical exam.  Bone marrow biopsy.  Your health care provider may also check your stool (feces) for blood and may do additional testing to look for the cause of your bleeding. You may also have other tests, including:  Imaging tests, such as a CT scan or MRI.  Endoscopy.  Colonoscopy.  How is this treated? Treatment for this condition depends on the cause. If you continue to lose a lot of blood,  you may need to be treated at a hospital. Treatment may include:  Taking supplements of iron, vitamin B12, or folic acid.  Taking a hormone medicine (erythropoietin) that can help to stimulate red blood cell growth.  Having a blood transfusion. This may be needed if you lose a lot of blood.  Making changes to your diet.  Having surgery to remove your spleen.  Follow these instructions at home:  Take over-the-counter and prescription medicines only as told by your health care provider.  Take supplements only as told by your health care provider.  Follow any diet instructions that you were given.  Keep all follow-up visits as told by your health care provider. This is important. Contact a health care provider if:  You develop new bleeding anywhere in the body. Get help right away if:  You are very weak.  You are short of breath.  You have pain in your abdomen or chest.  You are dizzy or feel faint.  You have trouble concentrating.  You have bloody or black, tarry stools.  You vomit repeatedly or you vomit up blood. Summary  Anemia is a condition in which you do not have enough red blood cells or enough of a substance in your red blood cells that carries oxygen (hemoglobin).  Symptoms may occur suddenly or develop slowly.  If your anemia is mild, you may not have symptoms.  This condition is diagnosed with blood tests as well as a medical history and physical exam. Other tests may be needed.  Treatment for this condition depends on the cause of the anemia. This information is not intended to replace advice   given to you by your health care provider. Make sure you discuss any questions you have with your health care provider. Document Released: 02/10/2004 Document Revised: 02/04/2016 Document Reviewed: 02/04/2016 Elsevier Interactive Patient Education  2018 [employer].  Anemia Anemia is a condition in which you do not have enough red blood cells or hemoglobin.  Hemoglobin is a substance in red blood cells that carries oxygen. When you do not have enough red blood cells or hemoglobin (are anemic), your body cannot get enough oxygen and your organs may not work properly. As a result, you may feel very tired or have other problems. What are the causes? Common causes of anemia include:  Excessive bleeding. Anemia can be caused by excessive bleeding inside or outside the body, including bleeding from the intestine or from periods in women.  Poor nutrition.  Long-lasting (chronic) kidney, thyroid, and liver disease.  Bone marrow disorders.  Cancer and treatments for cancer.  HIV (human immunodeficiency virus) and AIDS (acquired immunodeficiency syndrome).  Treatments for HIV and AIDS.  Spleen problems.  Blood disorders.  Infections, medicines, and autoimmune disorders that destroy red blood cells.  What are the signs or symptoms? Symptoms of this condition include:  Minor weakness.  Dizziness.  Headache.  Feeling heartbeats that are irregular or faster than normal (palpitations).  Shortness of breath, especially with exercise.  Paleness.  Cold sensitivity.  Indigestion.  Nausea.  Difficulty sleeping.  Difficulty concentrating.  Symptoms may occur suddenly or develop slowly. If your anemia is mild, you may not have symptoms. How is this diagnosed? This condition is diagnosed based on:  Blood tests.  Your medical history.  A physical exam.  Bone marrow biopsy.  Your health care provider may also check your stool (feces) for blood and may do additional testing to look for the cause of your bleeding. You may also have other tests, including:  Imaging tests, such as a CT scan or MRI.  Endoscopy.  Colonoscopy.  How is this treated? Treatment for this condition depends on the cause. If you continue to lose a lot of blood, you may need to be treated at a hospital. Treatment may include:  Taking supplements of iron,  vitamin C94, or folic acid.  Taking a hormone medicine (erythropoietin) that can help to stimulate red blood cell growth.  Having a blood transfusion. This may be needed if you lose a lot of blood.  Making changes to your diet.  Having surgery to remove your spleen.  Follow these instructions at home:  Take over-the-counter and prescription medicines only as told by your health care provider.  Take supplements only as told by your health care provider.  Follow any diet instructions that you were given.  Keep all follow-up visits as told by your health care provider. This is important. Contact a health care provider if:  You develop new bleeding anywhere in the body. Get help right away if:  You are very weak.  You are short of breath.  You have pain in your abdomen or chest.  You are dizzy or feel faint.  You have trouble concentrating.  You have bloody or black, tarry stools.  You vomit repeatedly or you vomit up blood. Summary  Anemia is a condition in which you do not have enough red blood cells or enough of a substance in your red blood cells that carries oxygen (hemoglobin).  Symptoms may occur suddenly or develop slowly.  If your anemia is mild, you may not have symptoms.  This  condition is diagnosed with blood tests as well as a medical history and physical exam. Other tests may be needed.  Treatment for this condition depends on the cause of the anemia. This information is not intended to replace advice given to you by your health care provider. Make sure you discuss any questions you have with your health care provider. Document Released: 02/10/2004 Document Revised: 02/04/2016 Document Reviewed: 02/04/2016 Elsevier Interactive Patient Education  [employer].
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
## 2017-07-03 ENCOUNTER — Other Ambulatory Visit: Payer: Self-pay | Admitting: Nurse Practitioner

## 2017-07-03 DIAGNOSIS — N920 Excessive and frequent menstruation with regular cycle: Secondary | ICD-10-CM

## 2017-07-03 LAB — CBC
Hematocrit: 26.2 % — ABNORMAL LOW (ref 34.0–46.6)
Hemoglobin: 7.6 g/dL — ABNORMAL LOW (ref 11.1–15.9)
MCH: 21.1 pg — ABNORMAL LOW (ref 26.6–33.0)
MCHC: 29 g/dL — AB (ref 31.5–35.7)
MCV: 73 fL — ABNORMAL LOW (ref 79–97)
Platelets: 394 10*3/uL (ref 150–450)
RBC: 3.61 x10E6/uL — ABNORMAL LOW (ref 3.77–5.28)
RDW: 17.7 % — AB (ref 12.3–15.4)
WBC: 7 10*3/uL (ref 3.4–10.8)

## 2017-08-15 ENCOUNTER — Telehealth: Payer: Self-pay | Admitting: Nurse Practitioner

## 2017-08-15 NOTE — Telephone Encounter (Signed)
Pt called to request a nurse call back, due to light back pain. Please follow up when possible.did not want an appt

## 2017-08-16 NOTE — Telephone Encounter (Signed)
Will route to PCP.  CMA did attempt to call patient back, no answer, and left a Vm.
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
## 2017-08-27 MED FILL — PENICILLIN VK 500 MG TABLET: 500 | 7 days supply | Qty: 28 | Fill #0

## 2017-08-30 ENCOUNTER — Encounter: Payer: Self-pay | Admitting: Nurse Practitioner

## 2017-08-30 NOTE — Telephone Encounter (Signed)
Patient's letter is in chart. Please let her know she can pick up at her convenience.

## 2017-08-30 NOTE — Telephone Encounter (Signed)
CMA spoke to patient to inform her letter she request is ready for her.  Patient stated she will print it off on MyChart instead of picking it up.

## 2017-08-31 ENCOUNTER — Encounter: Payer: Self-pay | Admitting: Obstetrics & Gynecology

## 2017-10-18 ENCOUNTER — Ambulatory Visit (INDEPENDENT_AMBULATORY_CARE_PROVIDER_SITE_OTHER): Payer: Self-pay | Admitting: Obstetrics & Gynecology

## 2017-10-18 DIAGNOSIS — D649 Anemia, unspecified: Secondary | ICD-10-CM | POA: Insufficient documentation

## 2017-10-18 DIAGNOSIS — N920 Excessive and frequent menstruation with regular cycle: Secondary | ICD-10-CM

## 2017-10-18 DIAGNOSIS — D500 Iron deficiency anemia secondary to blood loss (chronic): Secondary | ICD-10-CM

## 2017-10-18 MED ORDER — FERROUS SULFATE 325 (65 FE) MG PO TABS: 325.0000 mg | ORAL_TABLET | Freq: Every day | ORAL | 3 refills | Status: DC

## 2017-10-18 MED ORDER — NAPROXEN 500 MG PO TABS: 500.0000 mg | ORAL_TABLET | Freq: Two times a day (BID) | ORAL | 3 refills | Status: DC

## 2017-10-18 NOTE — Addendum Note (Signed)
Addended by: Woodroe Mode on: 10/18/2017 05:06 PM   Modules accepted: Orders

## 2017-10-18 NOTE — Progress Notes (Signed)
Patient ID: Meredith Nichols, female   DOB: Sep 27, 1970, 47 y.o.   MRN: [id number]  Chief Complaint  Patient presents with  . Menorrhagia    HPI Meredith Nichols is a 47 y.o. female.  G3P3 Patient's last menstrual period was 10/04/2017. She has regular menses lasting 7 days with 2 days of heavy flow. No cramping HPI  Past Medical History:  Diagnosis Date  . Anemia     Past Surgical History:  Procedure Laterality Date  . c sections    . CESAREAN SECTION     2 previous    Family History  Problem Relation Age of Onset  . Hypertension Mother   . Diabetes Mother   . Kidney disease Mother   . Diabetes Brother     Social History Social History   Tobacco Use  . Smoking status: Never Smoker  . Smokeless tobacco: Never Used  Substance Use Topics  . Alcohol use: No  . Drug use: No    Allergies  Allergen Reactions  . Tomato Flavor [Flavoring Agent]     Lip swell up    Current Outpatient Medications  Medication Sig Dispense Refill  . naproxen (NAPROSYN) 500 MG tablet Take 1 tablet (500 mg total) by mouth 2 (two) times daily with a meal. 60 tablet 3  . penicillin v potassium (VEETID) 500 MG tablet Take 500 mg by mouth every 6 (six) hours.  0   No current facility-administered medications for this visit.     Review of Systems Review of Systems  Constitutional: Negative.   Respiratory: Negative.   Cardiovascular: Negative.   Gastrointestinal: Negative.   Genitourinary: Positive for menstrual problem and vaginal bleeding. Negative for vaginal discharge.    Blood pressure 126/71, pulse 92, weight 236 lb (107 kg), last menstrual period 10/04/2017, unknown if currently breastfeeding.  Physical Exam Physical Exam  Constitutional: She appears well-developed and well-nourished.  Cardiovascular: Normal rate.  Pulmonary/Chest: Effort normal.  Skin: Skin is warm and dry. No pallor.  Psychiatric: She has a normal mood and affect. Her behavior is normal.  Vitals  reviewed.   Data Reviewed [place] 2004 CBC    Component Value Date/Time   WBC 7.0 07/02/2017 1643   RBC 3.61 (L) 07/02/2017 1643   HGB 7.6 (L) 07/02/2017 1643   HCT 26.2 (L) 07/02/2017 1643   PLT 394 07/02/2017 1643   MCV 73 (L) 07/02/2017 1643   MCH 21.1 (L) 07/02/2017 1643   MCHC 29.0 (L) 07/02/2017 1643   RDW 17.7 (H) 07/02/2017 1643     Assessment    Patient Active Problem List   Diagnosis Date Noted  . Menorrhagia with regular cycle 10/18/2017  . Anemia 10/18/2017  . History of diabetes mellitus, type II 02/05/2017       Plan    Naproxen BID during menses.  Pelvic US RTC 3 month Consider Mirena or ablation  Feso4 BID    Emeterio Reeve 10/18/2017, 2:39 PM

## 2017-10-18 NOTE — Patient Instructions (Signed)
Menorrhagia Menorrhagia is when your menstrual periods are heavy or last longer than usual. Follow these instructions at home:  Only take medicine as told by your doctor.  Take any iron pills as told by your doctor. Heavy bleeding may cause low levels of iron in your body.  Do not take aspirin 1 week before or during your period. Aspirin can make the bleeding worse.  Lie down for a while if you change your tampon or pad more than once in 2 hours. This may help lessen the bleeding.  Eat a healthy diet and foods with iron. These foods include leafy green vegetables, meat, liver, eggs, and whole grain breads and cereals.  Do not try to lose weight. Wait until the heavy bleeding has stopped and your iron level is normal. Contact a doctor if:  You soak through a pad or tampon every 1 or 2 hours, and this happens every time you have a period.  You need to use pads and tampons at the same time because you are bleeding so much.  You need to change your pad or tampon during the night.  You have a period that lasts for more than 8 days.  You pass clots bigger than 1 inch (2.5 cm) wide.  You have irregular periods that happen more or less often than once a month.  You feel dizzy or pass out (faint).  You feel very weak or tired.  You feel short of breath or feel your heart is beating too fast when you exercise.  You feel sick to your stomach (nausea) and you throw up (vomit) while you are taking your medicine.  You have watery poop (diarrhea) while you are taking your medicine.  You have any problems that may be related to the medicine you are taking. Get help right away if:  You soak through 4 or more pads or tampons in 2 hours.  You have any bleeding while you are pregnant. This information is not intended to replace advice given to you by your health care provider. Make sure you discuss any questions you have with your health care provider. Document Released: 10/12/2007 Document  Revised: 06/10/2015 Document Reviewed: 07/04/2012 Elsevier Interactive Patient Education  2017 Elsevier Inc.  

## 2017-10-19 LAB — CBC
HEMATOCRIT: 26.4 % — AB (ref 34.0–46.6)
Hemoglobin: 7.3 g/dL — ABNORMAL LOW (ref 11.1–15.9)
MCH: 20.9 pg — ABNORMAL LOW (ref 26.6–33.0)
MCHC: 27.7 g/dL — ABNORMAL LOW (ref 31.5–35.7)
MCV: 75 fL — ABNORMAL LOW (ref 79–97)
Platelets: 389 10*3/uL (ref 150–450)
RBC: 3.5 x10E6/uL — ABNORMAL LOW (ref 3.77–5.28)
RDW: 16.8 % — AB (ref 12.3–15.4)
WBC: 7.2 10*3/uL (ref 3.4–10.8)

## 2017-10-22 ENCOUNTER — Ambulatory Visit (HOSPITAL_COMMUNITY)
Admission: RE | Admit: 2017-10-22 | Discharge: 2017-10-22 | Disposition: A | Discharge status: Home / Self Care | Payer: Self-pay | Source: Ambulatory Visit | Attending: Obstetrics & Gynecology | Admitting: Obstetrics & Gynecology

## 2017-10-22 DIAGNOSIS — N920 Excessive and frequent menstruation with regular cycle: Secondary | ICD-10-CM | POA: Insufficient documentation

## 2017-10-22 DIAGNOSIS — D259 Leiomyoma of uterus, unspecified: Secondary | ICD-10-CM | POA: Insufficient documentation

## 2017-10-31 MED FILL — FERROUS SULFATE 325 MG TAB: 325 (65 FE) | 30 days supply | Qty: 30 | Fill #0

## 2017-10-31 MED FILL — NAPROXEN 500 MG TABLET: 500 | 30 days supply | Qty: 60 | Fill #0

## 2017-11-14 ENCOUNTER — Encounter: Payer: Self-pay | Admitting: Nurse Practitioner

## 2017-11-15 ENCOUNTER — Other Ambulatory Visit: Payer: Self-pay | Admitting: Nurse Practitioner

## 2017-11-15 MED ORDER — METHOCARBAMOL 500 MG PO TABS: 500.0000 mg | ORAL_TABLET | Freq: Three times a day (TID) | ORAL | 1 refills | Status: DC | PRN

## 2017-11-16 MED FILL — METHOCARBAMOL 500 MG TABS: 500 | 10 days supply | Qty: 30 | Fill #0

## 2018-05-23 DIAGNOSIS — R011 Cardiac murmur, unspecified: Secondary | ICD-10-CM | POA: Insufficient documentation

## 2019-02-21 LAB — HM DIABETES EYE EXAM

## 2019-02-25 ENCOUNTER — Encounter: Payer: Self-pay | Admitting: Nurse Practitioner

## 2019-02-25 ENCOUNTER — Ambulatory Visit: Payer: No Typology Code available for payment source | Admitting: Nurse Practitioner

## 2019-03-04 ENCOUNTER — Other Ambulatory Visit: Payer: Self-pay

## 2019-03-04 ENCOUNTER — Ambulatory Visit: Payer: 59 | Attending: Nurse Practitioner | Admitting: Nurse Practitioner

## 2019-03-04 ENCOUNTER — Encounter: Payer: Self-pay | Admitting: Nurse Practitioner

## 2019-03-04 DIAGNOSIS — E559 Vitamin D deficiency, unspecified: Secondary | ICD-10-CM | POA: Diagnosis not present

## 2019-03-04 DIAGNOSIS — R7303 Prediabetes: Secondary | ICD-10-CM | POA: Diagnosis not present

## 2019-03-04 DIAGNOSIS — R7989 Other specified abnormal findings of blood chemistry: Secondary | ICD-10-CM | POA: Diagnosis not present

## 2019-03-04 DIAGNOSIS — E785 Hyperlipidemia, unspecified: Secondary | ICD-10-CM

## 2019-03-04 DIAGNOSIS — N938 Other specified abnormal uterine and vaginal bleeding: Secondary | ICD-10-CM

## 2019-03-04 DIAGNOSIS — N939 Abnormal uterine and vaginal bleeding, unspecified: Secondary | ICD-10-CM

## 2019-03-04 NOTE — Progress Notes (Signed)
Virtual Visit via Telephone Note Due to national recommendations of social distancing due to [place] 19, telehealth visit is felt to be most appropriate for this patient at this time.  I discussed the limitations, risks, security and privacy concerns of performing an evaluation and management service by telephone and the availability of in person appointments. I also discussed with the patient that there may be a patient responsible charge related to this service. The patient expressed understanding and agreed to proceed.    I connected with Meredith Nichols on 03/04/19  at   3:50 PM EST  EDT by telephone and verified that I am speaking with the correct person using two identifiers.   Consent I discussed the limitations, risks, security and privacy concerns of performing an evaluation and management service by telephone and the availability of in person appointments. I also discussed with the patient that there may be a patient responsible charge related to this service. The patient expressed understanding and agreed to proceed.   Location of Patient: Private Residence   Location of Provider: [place] [facility] [employer] Office    Persons participating in Telemedicine visit: Geryl Rankins FNP-BC [place]    History of Present Illness: Telemedicine visit for: AUB   She has a history of anemia and menorrhagia. She now endorses metrorrhagia since receiving her Depo provera injection on 12-23. She has a history of menorrhagia and has seen GYN in the past who recommended IUD vs ablation. She decided against both and recently opted for depo. Menstrual cycles have been lighter in flow however she had 2 cycles this month. Wants to know if she should continue Depo. I explained to her that she may continue to have metrorrhagia with Depo and I can not give her a time frame as to how long. I also gave her other options she could discuss with a Gynecologist including: IUD,  hysterectomy, ablation or oral contraceptives. She would like to continue the next round of Depo for now.     Past Medical History:  Diagnosis Date  . Anemia     Past Surgical History:  Procedure Laterality Date  . c sections    . CESAREAN SECTION     2 previous    Family History  Problem Relation Age of Onset  . Hypertension Mother   . Diabetes Mother   . Kidney disease Mother   . Diabetes Brother     Social History   Socioeconomic History  . Marital status: Single    Spouse name: Not on file  . Number of children: Not on file  . Years of education: Not on file  . Highest education level: Not on file  Occupational History  . Not on file  Tobacco Use  . Smoking status: Never Smoker  . Smokeless tobacco: Never Used  Substance and Sexual Activity  . Alcohol use: No  . Drug use: No  . Sexual activity: Never  Other Topics Concern  . Not on file  Social History Narrative  . Not on file   Social Determinants of Health   Financial Resource Strain:   . Difficulty of Paying Living Expenses: Not on file  Food Insecurity:   . Worried About Charity fundraiser in the Last Year: Not on file  . Ran Out of Food in the Last Year: Not on file  Transportation Needs:   . Lack of Transportation (Medical): Not on file  . Lack of Transportation (Non-Medical): Not on file  Physical Activity:   . Days of Exercise per Week: Not on file  . Minutes of Exercise per Session: Not on file  Stress:   . Feeling of Stress : Not on file  Social Connections:   . Frequency of Communication with Friends and Family: Not on file  . Frequency of Social Gatherings with Friends and Family: Not on file  . Attends Religious Services: Not on file  . Active Member of Clubs or Organizations: Not on file  . Attends Archivist Meetings: Not on file  . Marital Status: Not on file     Observations/Objective: Awake, alert and oriented x 3   Review of Systems  Constitutional: Negative for  fever, malaise/fatigue and weight loss.  HENT: Negative.  Negative for nosebleeds.   Eyes: Negative.  Negative for blurred vision, double vision and photophobia.  Respiratory: Negative.  Negative for cough and shortness of breath.   Cardiovascular: Negative.  Negative for chest pain, palpitations and leg swelling.  Gastrointestinal: Negative.  Negative for heartburn, nausea and vomiting.  Genitourinary:       SEE HPI  Musculoskeletal: Negative.  Negative for myalgias.  Neurological: Negative.  Negative for dizziness, focal weakness, seizures and headaches.  Psychiatric/Behavioral: Negative.  Negative for suicidal ideas.    Assessment and Plan: [place] was seen today for establish care.  Diagnoses and all orders for this visit:  Abnormal uterine bleeding (AUB) -     CBC; Future  Prediabetes -     Hemoglobin A1c; Future  Vitamin D deficiency disease -     VITAMIN D 25 Hydroxy (Vit-D Deficiency, Fractures); Future  Dyslipidemia -     Lipid panel; Future  Abnormal blood creatinine level -     CMP14+EGFR; Future     Follow Up Instructions Return if symptoms worsen or fail to improve.     I discussed the assessment and treatment plan with the patient. The patient was provided an opportunity to ask questions and all were answered. The patient agreed with the plan and demonstrated an understanding of the instructions.   The patient was advised to call back or seek an in-person evaluation if the symptoms worsen or if the condition fails to improve as anticipated.  I provided 17 minutes of non-face-to-face time during this encounter including median intraservice time, reviewing previous notes, labs, imaging, medications and explaining diagnosis and management.  Gildardo Pounds, FNP-BC

## 2019-03-06 ENCOUNTER — Ambulatory Visit: Payer: No Typology Code available for payment source

## 2019-03-06 ENCOUNTER — Other Ambulatory Visit: Payer: Self-pay

## 2019-03-12 ENCOUNTER — Other Ambulatory Visit: Payer: Self-pay

## 2019-03-12 ENCOUNTER — Ambulatory Visit: Payer: No Typology Code available for payment source | Attending: Nurse Practitioner

## 2019-03-12 DIAGNOSIS — N939 Abnormal uterine and vaginal bleeding, unspecified: Secondary | ICD-10-CM

## 2019-03-12 DIAGNOSIS — R7303 Prediabetes: Secondary | ICD-10-CM

## 2019-03-12 DIAGNOSIS — R7989 Other specified abnormal findings of blood chemistry: Secondary | ICD-10-CM

## 2019-03-12 DIAGNOSIS — E785 Hyperlipidemia, unspecified: Secondary | ICD-10-CM

## 2019-03-12 DIAGNOSIS — E559 Vitamin D deficiency, unspecified: Secondary | ICD-10-CM

## 2019-03-13 ENCOUNTER — Telehealth: Payer: Self-pay | Admitting: Nurse Practitioner

## 2019-03-13 LAB — CBC
Hematocrit: 27.2 % — ABNORMAL LOW (ref 34.0–46.6)
Hemoglobin: 8.7 g/dL — ABNORMAL LOW (ref 11.1–15.9)
MCH: 27.4 pg (ref 26.6–33.0)
MCHC: 32 g/dL (ref 31.5–35.7)
MCV: 86 fL (ref 79–97)
Platelets: 302 10*3/uL (ref 150–450)
RBC: 3.17 x10E6/uL — ABNORMAL LOW (ref 3.77–5.28)
RDW: 14.4 % (ref 11.7–15.4)
WBC: 8.1 10*3/uL (ref 3.4–10.8)

## 2019-03-13 LAB — CMP14+EGFR
ALT: 10 IU/L (ref 0–32)
AST: 13 IU/L (ref 0–40)
Albumin/Globulin Ratio: 1.4 (ref 1.2–2.2)
Albumin: 3.8 g/dL (ref 3.8–4.8)
Alkaline Phosphatase: 44 IU/L (ref 39–117)
BUN/Creatinine Ratio: 17 (ref 9–23)
BUN: 10 mg/dL (ref 6–24)
Bilirubin Total: 0.2 mg/dL (ref 0.0–1.2)
CO2: 23 mmol/L (ref 20–29)
Calcium: 9.3 mg/dL (ref 8.7–10.2)
Chloride: 106 mmol/L (ref 96–106)
Creatinine, Ser: 0.59 mg/dL (ref 0.57–1.00)
GFR calc Af Amer: 124 mL/min/{1.73_m2} (ref >59)
GFR calc non Af Amer: 108 mL/min/{1.73_m2} (ref >59)
Globulin, Total: 2.7 g/dL (ref 1.5–4.5)
Glucose: 100 mg/dL — ABNORMAL HIGH (ref 65–99)
Potassium: 3.8 mmol/L (ref 3.5–5.2)
Sodium: 143 mmol/L (ref 134–144)
Total Protein: 6.5 g/dL (ref 6.0–8.5)

## 2019-03-13 LAB — VITAMIN D 25 HYDROXY (VIT D DEFICIENCY, FRACTURES): Vit D, 25-Hydroxy: 21 ng/mL — ABNORMAL LOW (ref 30.0–100.0)

## 2019-03-13 LAB — HEMOGLOBIN A1C
Est. average glucose Bld gHb Est-mCnc: 126 mg/dL
Hgb A1c MFr Bld: 6 % — ABNORMAL HIGH (ref 4.8–5.6)

## 2019-03-13 LAB — LIPID PANEL
Chol/HDL Ratio: 4.1 ratio (ref 0.0–4.4)
Cholesterol, Total: 191 mg/dL (ref 100–199)
HDL: 47 mg/dL (ref >39)
LDL Chol Calc (NIH): 127 mg/dL — ABNORMAL HIGH (ref 0–99)
Triglycerides: 91 mg/dL (ref 0–149)
VLDL Cholesterol Cal: 17 mg/dL (ref 5–40)

## 2019-03-13 NOTE — Telephone Encounter (Signed)
Patient was called to inform her that there was an error on her medical records release form. Patient was informed that she had not been seen at all in 2020 at [facility] and [facility]. Patient requested for medical records to be sent from 2019 only. Error was corrected on form and medical records were sent.

## 2019-03-14 ENCOUNTER — Other Ambulatory Visit: Payer: Self-pay | Admitting: Nurse Practitioner

## 2019-03-14 DIAGNOSIS — D500 Iron deficiency anemia secondary to blood loss (chronic): Secondary | ICD-10-CM

## 2019-03-14 MED ORDER — FERROUS SULFATE 325 (65 FE) MG PO TABS: 325.0000 mg | ORAL_TABLET | Freq: Every day | ORAL | 0 refills | Status: DC

## 2019-03-15 ENCOUNTER — Ambulatory Visit: Payer: 59 | Attending: Internal Medicine

## 2019-03-15 DIAGNOSIS — Z23 Encounter for immunization: Secondary | ICD-10-CM | POA: Insufficient documentation

## 2019-03-15 NOTE — Progress Notes (Signed)
   Covid-19 Vaccination Clinic  Name:  Meredith Nichols    MRN: [id number] DOB: 27-Jun-1970  03/15/2019  Meredith Nichols was observed post Covid-19 immunization for 15 minutes without incidence. She was provided with Vaccine Information Sheet and instruction to access the V-Safe system.   Meredith Nichols was instructed to call 911 with any severe reactions post vaccine: Marland Kitchen Difficulty breathing  . Swelling of your face and throat  . A fast heartbeat  . A bad rash all over your body  . Dizziness and weakness    Immunizations Administered    Name Date Dose VIS Date Route   Pfizer COVID-19 Vaccine 03/15/2019  9:23 AM 0.3 mL 12/27/2018 Intramuscular   Manufacturer: [place]   Lot: [id number]   [place]: [id number]

## 2019-03-16 ENCOUNTER — Ambulatory Visit: Payer: No Typology Code available for payment source

## 2019-03-26 ENCOUNTER — Ambulatory Visit: Payer: 59 | Attending: Nurse Practitioner

## 2019-03-26 ENCOUNTER — Other Ambulatory Visit: Payer: Self-pay

## 2019-03-26 DIAGNOSIS — Z3042 Encounter for surveillance of injectable contraceptive: Secondary | ICD-10-CM | POA: Diagnosis not present

## 2019-03-26 MED ORDER — MEDROXYPROGESTERONE ACETATE 150 MG/ML IM SUSP
150.0000 mg | Freq: Once | INTRAMUSCULAR | Status: AC
  Administered 2019-03-26: 150 mg via INTRAMUSCULAR

## 2019-03-26 NOTE — Progress Notes (Signed)
DOB and Name verified Pt is here for her DEPO injections/She decided to continue with Depo injection despite of given provider options. Last Depo-Provera: 01/08/2019 at [facility] /within time interval  Serum HCG indicated?Negative  Reinforced NP instructions that  she may continue to have metrorrhagia with Depo  and we cannot give her a time frame as to how long. Denied any bleeding at this time or any illneses.  Made pt aware of NP instructions and other options she could discuss with a Gynecologist including: IUD, hysterectomy, ablation or oral contraceptive if beading reoccur. Verbalized understanding    Depo-Provera 150 mg IM given in R gluteus medius muscle . Injection well tolerated. No reaction noted at the injection  Education given to pt in regards to use extra protection and possible false negative HGC if antibiotic intake or missed intervals.Verbalized understanding. Made pt aware if continuing with Depo next appt should be scheduled between May 26 to June 9.

## 2019-04-05 ENCOUNTER — Ambulatory Visit: Payer: 59 | Attending: Internal Medicine

## 2019-04-05 DIAGNOSIS — Z23 Encounter for immunization: Secondary | ICD-10-CM

## 2019-04-05 NOTE — Progress Notes (Signed)
   Covid-19 Vaccination Clinic  Name:  Meredith Nichols    MRN: [id number] DOB: 1971/01/08  04/05/2019  Ms. [name] was observed post Covid-19 immunization for 15 minutes without incident. She was provided with Vaccine Information Sheet and instruction to access the V-Safe system.   [name] was instructed to call 911 with any severe reactions post vaccine: Marland Kitchen Difficulty breathing  . Swelling of face and throat  . A fast heartbeat  . A bad rash all over body  . Dizziness and weakness   Immunizations Administered    Name Date Dose VIS Date Route   Pfizer COVID-19 Vaccine 04/05/2019 10:17 AM 0.3 mL 12/27/2018 Intramuscular   Manufacturer: [place]   Lot: [id number]   [place]: [id number]

## 2019-04-08 ENCOUNTER — Ambulatory Visit: Payer: No Typology Code available for payment source

## 2019-04-09 ENCOUNTER — Ambulatory Visit: Payer: No Typology Code available for payment source

## 2019-06-11 ENCOUNTER — Ambulatory Visit: Payer: 59 | Attending: Nurse Practitioner

## 2019-06-11 ENCOUNTER — Other Ambulatory Visit: Payer: Self-pay

## 2019-06-11 VITALS — Temp 98.0°F

## 2019-06-11 DIAGNOSIS — Z3042 Encounter for surveillance of injectable contraceptive: Secondary | ICD-10-CM | POA: Diagnosis not present

## 2019-06-11 MED ORDER — MEDROXYPROGESTERONE ACETATE 150 MG/ML IM SUSP
150.0000 mg | Freq: Once | INTRAMUSCULAR | Status: AC
  Administered 2019-06-11: 150 mg via INTRAMUSCULAR

## 2019-06-11 NOTE — Progress Notes (Signed)
DOB and Name verified Pt is here for her DEPO injections/She decided to continue with Depo injection despite of given provider options. Last Depo-Provera:03/26/2019 /within time interval  Serum HCG indicated?no  Reinforced NP instructions that  she may continue to have metrorrhagia with Depo  and we cannot give her a time frame as to how long. Denied any bleeding at this time or any illneses.  Made pt aware of NP instructions and other options she could discuss with a Gynecologist. Verbalized understanding    Depo-Provera 150 mg IM given in L gluteus medius muscle . Injection well tolerated. No reaction noted at the injection  Education given to pt in regards to use extra protection and possible false negative HGC if antibiotic intake or missed intervals.Verbalized understanding. Made pt aware if continuing with Depo next appt should be scheduled between August 11 to August 25.

## 2019-08-23 ENCOUNTER — Encounter: Payer: Self-pay | Admitting: Nurse Practitioner

## 2019-08-24 ENCOUNTER — Other Ambulatory Visit: Payer: Self-pay | Admitting: Nurse Practitioner

## 2019-08-24 DIAGNOSIS — D500 Iron deficiency anemia secondary to blood loss (chronic): Secondary | ICD-10-CM

## 2019-08-24 DIAGNOSIS — N920 Excessive and frequent menstruation with regular cycle: Secondary | ICD-10-CM

## 2019-08-27 ENCOUNTER — Ambulatory Visit: Payer: 59

## 2019-09-02 ENCOUNTER — Other Ambulatory Visit: Payer: Self-pay

## 2019-09-02 ENCOUNTER — Ambulatory Visit: Payer: 59 | Attending: Family Medicine | Admitting: *Deleted

## 2019-09-02 VITALS — Wt 247.0 lb

## 2019-09-02 DIAGNOSIS — Z3042 Encounter for surveillance of injectable contraceptive: Secondary | ICD-10-CM | POA: Diagnosis not present

## 2019-09-02 MED ORDER — MEDROXYPROGESTERONE ACETATE 150 MG/ML IM SUSP
150.0000 mg | Freq: Once | INTRAMUSCULAR | Status: AC
  Administered 2019-09-02: 150 mg via INTRAMUSCULAR

## 2019-09-02 NOTE — Progress Notes (Signed)
Date last pap:  Last Depo-Provera: Jun 11, 2019. Side Effects if any: patient denies any. Serum HCG indicated? n/a. Depo-Provera 150 mg IM given in right upper outer quadrant. Next appointment due November 2- November 16

## 2019-09-15 ENCOUNTER — Ambulatory Visit: Payer: 59

## 2019-10-27 ENCOUNTER — Ambulatory Visit: Payer: 59 | Admitting: Obstetrics & Gynecology

## 2019-11-18 ENCOUNTER — Other Ambulatory Visit: Payer: Self-pay

## 2019-11-18 ENCOUNTER — Ambulatory Visit: Payer: 59 | Attending: Nurse Practitioner | Admitting: *Deleted

## 2019-11-18 DIAGNOSIS — Z3042 Encounter for surveillance of injectable contraceptive: Secondary | ICD-10-CM | POA: Diagnosis not present

## 2019-11-18 MED ORDER — MEDROXYPROGESTERONE ACETATE 150 MG/ML IM SUSP
150.0000 mg | Freq: Once | INTRAMUSCULAR | Status: AC
  Administered 2019-11-18: 150 mg via INTRAMUSCULAR

## 2019-11-18 NOTE — Progress Notes (Signed)
Date last pap: 02/12/2017 Last Depo-Provera: September 02, 2019 Side Effects if any: n/a Serum HCG indicated? n/a Depo-Provera 150 mg IM given by T.Zayonna Ayuso,RN in left upper outer quadrant. Patient tolerated procedure well.  Next appointment due Jan 18- Feb 1

## 2019-11-24 ENCOUNTER — Encounter: Payer: Self-pay | Admitting: Nurse Practitioner

## 2019-11-26 ENCOUNTER — Other Ambulatory Visit: Payer: Self-pay | Admitting: Nurse Practitioner

## 2019-11-26 DIAGNOSIS — Z1231 Encounter for screening mammogram for malignant neoplasm of breast: Secondary | ICD-10-CM

## 2019-12-21 ENCOUNTER — Other Ambulatory Visit: Payer: Self-pay | Admitting: Nurse Practitioner

## 2019-12-21 DIAGNOSIS — D500 Iron deficiency anemia secondary to blood loss (chronic): Secondary | ICD-10-CM

## 2019-12-21 NOTE — Telephone Encounter (Signed)
Requested medication (s) are due for refill today: -  Requested medication (s) are on the active medication list: expired  Last refill:  03/14/19  Future visit scheduled: no  Notes to clinic:  Prescription expired 06/12/19    Requested Prescriptions  Pending Prescriptions Disp Refills   ferrous sulfate 325 (65 FE) MG tablet [Pharmacy Med Name: FERROUS SULFATE 325 MG TABLET] 90 tablet 0    Sig: TAKE 1 TABLET BY [place]      Endocrinology:  Minerals - Iron Supplementation Failed - 12/21/2019 12:06 AM      Failed - HGB in normal range and within 360 days    Hemoglobin  Date Value Ref Range Status  03/12/2019 8.7 (L) 11.1 - 15.9 g/dL Final          Failed - HCT in normal range and within 360 days    Hematocrit  Date Value Ref Range Status  03/12/2019 27.2 (L) 34.0 - 46.6 % Final          Failed - RBC in normal range and within 360 days    RBC  Date Value Ref Range Status  03/12/2019 3.17 (L) 3.77 - 5.28 x10E6/uL Final          Failed - Fe (serum) in normal range and within 360 days    No results found for: IRON, IRONPCTSAT        Failed - Ferritin in normal range and within 360 days    No results found for: FERRITIN        Passed - Valid encounter within last 12 months    Recent Outpatient Visits           9 months ago Abnormal uterine bleeding (AUB)   [place], [place] W, NP   2 years ago Iron deficiency anemia due to chronic blood loss   [place], [place] W, NP   2 years ago Pap smear for cervical cancer screening   [place], Vernia Buff, NP   2 years ago Chronic pain of right knee   [place], Vernia Buff, NP

## 2019-12-29 ENCOUNTER — Encounter: Payer: Self-pay | Admitting: Nurse Practitioner

## 2019-12-29 ENCOUNTER — Other Ambulatory Visit: Payer: Self-pay | Admitting: Nurse Practitioner

## 2019-12-29 DIAGNOSIS — Z1211 Encounter for screening for malignant neoplasm of colon: Secondary | ICD-10-CM

## 2020-01-01 ENCOUNTER — Ambulatory Visit
Admission: RE | Admit: 2020-01-01 | Discharge: 2020-01-01 | Disposition: A | Discharge status: Home / Self Care | Payer: 59 | Source: Ambulatory Visit | Attending: Nurse Practitioner | Admitting: Nurse Practitioner

## 2020-01-01 ENCOUNTER — Other Ambulatory Visit: Payer: Self-pay

## 2020-01-01 DIAGNOSIS — Z1231 Encounter for screening mammogram for malignant neoplasm of breast: Secondary | ICD-10-CM

## 2020-02-03 ENCOUNTER — Ambulatory Visit: Payer: 59

## 2020-02-04 ENCOUNTER — Other Ambulatory Visit: Payer: Self-pay

## 2020-02-04 ENCOUNTER — Ambulatory Visit: Payer: 59 | Attending: Nurse Practitioner

## 2020-02-04 DIAGNOSIS — Z3042 Encounter for surveillance of injectable contraceptive: Secondary | ICD-10-CM

## 2020-02-04 MED ORDER — MEDROXYPROGESTERONE ACETATE 150 MG/ML IM SUSP
150.0000 mg | Freq: Once | INTRAMUSCULAR | Status: AC
  Administered 2020-07-13: 16:00:00 150 mg via INTRAMUSCULAR

## 2020-02-04 NOTE — Patient Instructions (Signed)
Schedule next visit between April 6-April 20  Medroxyprogesterone injection [Contraceptive] What is this medicine? MEDROXYPROGESTERONE (me DROX ee proe JES te rone) contraceptive injections prevent pregnancy. They provide effective birth control for 3 months. Depo-SubQ Provera 104 injection is also used for treating pain related to endometriosis. This medicine may be used for other purposes; ask your health care provider or pharmacist if you have questions. COMMON BRAND NAME(S): Depo-Provera, Depo-subQ Provera 104 What should I tell my health care provider before I take this medicine? They need to know if you have any of these conditions:  asthma  blood clots  breast cancer or family history of breast cancer  depression  diabetes  eating disorder (anorexia nervosa)  heart attack  high blood pressure  HIV infection or AIDS  if you often drink alcohol  kidney disease  liver disease  migraine headaches  osteoporosis, weak bones  seizures  stroke  tobacco smoker  vaginal bleeding  an unusual or allergic reaction to medroxyprogesterone, other hormones, medicines, foods, dyes, or preservatives  pregnant or trying to get pregnant  breast-feeding How should I use this medicine? Depo-Provera CI contraceptive injection is given into a muscle. Depo-subQ Provera 104 injection is given under the skin. It is given by a health care provider in a hospital or clinic setting. The injection is usually given during the first 5 days after the start of a menstrual period or 6 weeks after delivery of a baby. A patient package insert for the product will be given with each prescription and refill. Be sure to read this information carefully each time. The sheet may change often. Talk to your pediatrician regarding the use of this medicine in children. Special care may be needed. These injections have been used in female children who have started having menstrual periods. Overdosage: If  you think you have taken too much of this medicine contact a poison control center or emergency room at once. NOTE: This medicine is only for you. Do not share this medicine with others. What if I miss a dose? Keep appointments for follow-up doses. You must get an injection once every 3 months. It is important not to miss your dose. Call your health care provider if you are unable to keep an appointment. What may interact with this medicine?  antibiotics or medicines for infections, especially rifampin and griseofulvin  antivirals for HIV or hepatitis  aprepitant  armodafinil  bexarotene  bosentan  medicines for seizures like carbamazepine, felbamate, oxcarbazepine, phenytoin, phenobarbital, primidone, topiramate  mitotane  modafinil  St. John's wort This list may not describe all possible interactions. Give your health care provider a list of all the medicines, herbs, non-prescription drugs, or dietary supplements you use. Also tell them if you smoke, drink alcohol, or use illegal drugs. Some items may interact with your medicine. What should I watch for while using this medicine? This drug does not protect you against HIV infection (AIDS) or other sexually transmitted diseases. Use of this product may cause you to lose calcium from your bones. Loss of calcium may cause weak bones (osteoporosis). Only use this product for more than 2 years if other forms of birth control are not right for you. The longer you use this product for birth control the more likely you will be at risk for weak bones. Ask your health care professional how you can keep strong bones. You may have a change in bleeding pattern or irregular periods. Many females stop having periods while taking this drug. If  you have received your injections on time, your chance of being pregnant is very low. If you think you may be pregnant, see your health care professional as soon as possible. Tell your health care professional  if you want to get pregnant within the next year. The effect of this medicine may last a long time after you get your last injection. What side effects may I notice from receiving this medicine? Side effects that you should report to your doctor or health care professional as soon as possible:  allergic reactions like skin rash, itching or hives, swelling of the face, lips, or tongue  blood clot (chest pain; shortness of breath; pain, swelling, or warmth in the leg)  breast tenderness or discharge  changes in emotions or moods  changes in vision  liver injury (dark yellow or brown urine; general ill feeling or flu-like symptoms; loss of appetite, right upper belly pain; unusually weak or tired, yellowing of the eyes or skin)  persistent pain, pus, or bleeding at the injection site  stroke (changes in vision; confusion; trouble speaking or understanding; severe headaches; sudden numbness or weakness of the face, arm or leg; trouble walking; dizziness; loss of balance or coordination)  trouble breathing Side effects that usually do not require medical attention (report to your doctor or health care professional if they continue or are bothersome):  change in sex drive  dizziness  fluid retention  headache  irregular periods, spotting, or absent periods  pain, redness, or irritation at site where injected  stomach pain  weight gain This list may not describe all possible side effects. Call your doctor for medical advice about side effects. You may report side effects to FDA at 1-800-FDA-1088. Where should I keep my medicine? This injection is only given by a health care provider. It will not be stored at home. NOTE: This sheet is a summary. It may not cover all possible information. If you have questions about this medicine, talk to your doctor, pharmacist, or health care provider.  2021 [employer]vier/Gold Standard (2019-02-19 10:29:21)

## 2020-02-04 NOTE — Progress Notes (Signed)
Pt came into the office today for Depo

## 2020-03-19 ENCOUNTER — Ambulatory Visit (AMBULATORY_SURGERY_CENTER): Payer: Self-pay | Admitting: *Deleted

## 2020-03-19 ENCOUNTER — Other Ambulatory Visit: Payer: Self-pay

## 2020-03-19 VITALS — Ht 66.0 in | Wt 247.0 lb

## 2020-03-19 DIAGNOSIS — Z1211 Encounter for screening for malignant neoplasm of colon: Secondary | ICD-10-CM

## 2020-03-19 MED ORDER — SUPREP BOWEL PREP KIT 17.5-3.13-1.6 GM/177ML PO SOLN: 1.0000 | Freq: Once | ORAL | 0 refills | Status: AC

## 2020-03-19 NOTE — Progress Notes (Signed)

## 2020-04-02 ENCOUNTER — Ambulatory Visit: Payer: 59 | Admitting: Gastroenterology

## 2020-04-02 ENCOUNTER — Other Ambulatory Visit: Payer: Self-pay

## 2020-04-02 ENCOUNTER — Encounter: Payer: Self-pay | Admitting: Gastroenterology

## 2020-04-02 VITALS — BP 152/87 | HR 72 | Temp 97.5°F | Resp 12 | Ht 66.0 in | Wt 247.0 lb

## 2020-04-02 DIAGNOSIS — Z1211 Encounter for screening for malignant neoplasm of colon: Secondary | ICD-10-CM

## 2020-04-02 MED ORDER — SODIUM CHLORIDE 0.9 % IV SOLN: 500.0000 mL | Freq: Once | INTRAVENOUS | Status: DC

## 2020-04-02 NOTE — Progress Notes (Signed)
pt tolerated well. VSS. awake and to recovery. Report given to RN.  

## 2020-04-02 NOTE — Progress Notes (Signed)
VS-JD  Pt's states no medical or surgical changes since previsit or office visit.

## 2020-04-02 NOTE — Patient Instructions (Signed)
Start a high fiber diet, take fibercon 1-2 tablets by mouth daily. Continue present medications. Await pathology results. Repeat colonoscopy in 10 years for screening purposes.  YOU HAD AN ENDOSCOPIC PROCEDURE TODAY AT [facility]:   Refer to the procedure report that was given to you for any specific questions about what was found during the examination.  If the procedure report does not answer your questions, please call your gastroenterologist to clarify.  If you requested that your care partner not be given the details of your procedure findings, then the procedure report has been included in a sealed envelope for you to review at your convenience later.  YOU SHOULD EXPECT: Some feelings of bloating in the abdomen. Passage of more gas than usual.  Walking can help get rid of the air that was put into your GI tract during the procedure and reduce the bloating. If you had a lower endoscopy (such as a colonoscopy or flexible sigmoidoscopy) you may notice spotting of blood in your stool or on the toilet paper. If you underwent a bowel prep for your procedure, you may not have a normal bowel movement for a few days.  Please Note:  You might notice some irritation and congestion in your nose or some drainage.  This is from the oxygen used during your procedure.  There is no need for concern and it should clear up in a day or so.  SYMPTOMS TO REPORT IMMEDIATELY:   Following lower endoscopy (colonoscopy or flexible sigmoidoscopy):  Excessive amounts of blood in the stool  Significant tenderness or worsening of abdominal pains  Swelling of the abdomen that is new, acute  Fever of 100F or higher   For urgent or emergent issues, a gastroenterologist can be reached at any hour by calling [phone]. Do not use MyChart messaging for urgent concerns.    DIET:  We do recommend a small meal at first, but then you may proceed to your regular diet.  Drink plenty of fluids but you  should avoid alcoholic beverages for 24 hours.  ACTIVITY:  You should plan to take it easy for the rest of today and you should NOT DRIVE or use heavy machinery until tomorrow (because of the sedation medicines used during the test).    FOLLOW UP: Our staff will call the number listed on your records 48-72 hours following your procedure to check on you and address any questions or concerns that you may have regarding the information given to you following your procedure. If we do not reach you, we will leave a message.  We will attempt to reach you two times.  During this call, we will ask if you have developed any symptoms of COVID 19. If you develop any symptoms (ie: fever, flu-like symptoms, shortness of breath, cough etc.) before then, please call [phone].  If you test positive for Covid 19 in the 2 weeks post procedure, please call and report this information to [place].    If any biopsies were taken you will be contacted by phone or by letter within the next 1-3 weeks.  Please call us at [phone] if you have not heard about the biopsies in 3 weeks.    SIGNATURES/CONFIDENTIALITY: You and/or your care partner have signed paperwork which will be entered into your electronic medical record.  These signatures attest to the fact that that the information above on your After Visit Summary has been reviewed and is understood.  Full responsibility of the confidentiality of  this discharge information lies with you and/or your care-partner.

## 2020-04-02 NOTE — Op Note (Signed)
Reader Patient Name: Meredith Nichols Procedure Date: 04/02/2020 11:03 AM MRN: [id number] Endoscopist: Justice Britain , MD Age: 50 Referring MD:  Date of Birth: 1970-03-11 Gender: Female Account #: [id number] Procedure:                Colonoscopy Indications:              Screening for colorectal malignant neoplasm, This                            is the patient's first colonoscopy Medicines:                Monitored Anesthesia Care Procedure:                Pre-Anesthesia Assessment:                           - Prior to the procedure, a History and Physical                            was performed, and patient medications and                            allergies were reviewed. The patient's tolerance of                            previous anesthesia was also reviewed. The risks                            and benefits of the procedure and the sedation                            options and risks were discussed with the patient.                            All questions were answered, and informed consent                            was obtained. Prior Anticoagulants: The patient has                            taken no previous anticoagulant or antiplatelet                            agents except for NSAID medication. ASA Grade                            Assessment: II - A patient with mild systemic                            disease. After reviewing the risks and benefits,                            the patient was deemed in satisfactory condition to  undergo the procedure.                           After obtaining informed consent, the colonoscope                            was passed under direct vision. Throughout the                            procedure, the patient's blood pressure, pulse, and                            oxygen saturations were monitored continuously. The                            Olympus CF-HQ190L (Serial# [id number])  Colonoscope was                            introduced through the anus and advanced to the 5                            cm into the ileum. The colonoscopy was performed                            without difficulty. The colonoscopy was somewhat                            difficult due to restricted mobility of the colon.                            Successful completion of the procedure was aided by                            changing the patient to a supine position, using                            manual pressure, withdrawing and reinserting the                            scope, straightening and shortening the scope to                            obtain bowel loop reduction and using scope                            torsion. The patient tolerated the procedure. The                            quality of the bowel preparation was good. The                            terminal ileum, ileocecal valve, appendiceal  orifice, and rectum were photographed. Scope In: 11:19:55 AM Scope Out: 11:35:37 AM Scope Withdrawal Time: 0 hours 8 minutes 46 seconds  Total Procedure Duration: 0 hours 15 minutes 42 seconds  Findings:                 The digital rectal exam findings include                            hemorrhoids. Pertinent negatives include no                            palpable rectal lesions.                           The recto-sigmoid colon and sigmoid colon were                            significantly tortuous as noted above.                           The terminal ileum and ileocecal valve appeared                            normal.                           A diffuse area of moderately erythematous mucosa                            was found in the mid rectum and in the distal                            rectum. Biopsies were taken with a cold forceps for                            histology to rule out chronic colitis.                           Normal mucosa was  found in the entire colon                            otherwise.                           Non-bleeding non-thrombosed internal hemorrhoids                            were found during retroflexion, during perianal                            exam and during digital exam. The hemorrhoids were                            Grade II (internal hemorrhoids that prolapse but  reduce spontaneously). Complications:            No immediate complications. Estimated Blood Loss:     Estimated blood loss was minimal. Impression:               - Hemorrhoids found on digital rectal exam.                           - Tortuous left colon.                           - The examined portion of the ileum was normal.                           - Erythematous mucosa in the mid rectum and in the                            distal rectum. Biopsied to rule out chronic                            proctitis.                           - Normal mucosa in the entire examined colon                            otherwise.                           - Non-bleeding non-thrombosed internal hemorrhoids. Recommendation:           - The patient will be observed post-procedure,                            until all discharge criteria are met.                           - Discharge patient to home.                           - Patient has a contact number available for                            emergencies. The signs and symptoms of potential                            delayed complications were discussed with the                            patient. Return to normal activities tomorrow.                            Written discharge instructions were provided to the                            patient.                           -  High fiber diet.                           - Use FiberCon 1-2 tablets PO daily.                           - Continue present medications.                           - Await pathology  results.                           - Repeat colonoscopy in 10 years for screening                            purposes most likely.                           - The findings and recommendations were discussed                            with the patient.                           - The findings and recommendations were discussed                            with the patient's family. Justice Britain, MD 04/02/2020 11:41:42 AM

## 2020-04-02 NOTE — Progress Notes (Signed)
Called to room to assist during endoscopic procedure.  Patient ID and intended procedure confirmed with present staff. Received instructions for my participation in the procedure from the performing physician.  

## 2020-04-06 ENCOUNTER — Telehealth: Payer: Self-pay

## 2020-04-06 NOTE — Telephone Encounter (Signed)
  Follow up Call-  Call back number 04/02/2020  Post procedure Call Back phone  # [phone]  Permission to leave phone message Yes  Some recent data might be hidden     Patient questions:  Do you have a fever, pain , or abdominal swelling? No. Pain Score  0 *  Have you tolerated food without any problems? Yes.    Have you been able to return to your normal activities? Yes.    Do you have any questions about your discharge instructions: Diet   No. Medications  No. Follow up visit  No.  Do you have questions or concerns about your Care? No.  Actions: * If pain score is 4 or above: No action needed, pain <4.   1. Have you developed a fever since your procedure? No   2.   Have you had an respiratory symptoms (SOB or cough) since your procedure? No   3.   Have you tested positive for COVID 19 since your procedure no   4.   Have you had any family members/close contacts diagnosed with the COVID 19 since your procedure?  No    If yes to any of these questions please route to Joylene John, RN and Joella Prince, RN

## 2020-04-08 ENCOUNTER — Encounter: Payer: Self-pay | Admitting: Gastroenterology

## 2020-04-21 ENCOUNTER — Other Ambulatory Visit: Payer: Self-pay

## 2020-04-21 ENCOUNTER — Ambulatory Visit: Payer: 59 | Attending: Nurse Practitioner

## 2020-04-21 DIAGNOSIS — Z3042 Encounter for surveillance of injectable contraceptive: Secondary | ICD-10-CM | POA: Diagnosis not present

## 2020-04-21 MED ORDER — MEDROXYPROGESTERONE ACETATE 150 MG/ML IM SUSP
150.0000 mg | Freq: Once | INTRAMUSCULAR | Status: AC
  Administered 2020-04-21: 150 mg via INTRAMUSCULAR

## 2020-04-21 NOTE — Progress Notes (Signed)
Pt arrived at clinic for depo shot Pt on time for shot so no urine HCG needed Depo-shot was given in right ventragluteal. Next shot due JUN-[id number]

## 2020-07-13 ENCOUNTER — Ambulatory Visit: Payer: 59 | Attending: Family Medicine

## 2020-07-13 ENCOUNTER — Other Ambulatory Visit: Payer: Self-pay

## 2020-07-13 DIAGNOSIS — Z3042 Encounter for surveillance of injectable contraceptive: Secondary | ICD-10-CM | POA: Diagnosis not present

## 2020-07-13 NOTE — Progress Notes (Signed)
Pt came into the office today for depo injection   No HCG needed   Next depo due Sept 13-Sept 27

## 2020-09-03 ENCOUNTER — Ambulatory Visit: Payer: 59 | Attending: Nurse Practitioner | Admitting: Nurse Practitioner

## 2020-09-03 ENCOUNTER — Encounter: Payer: Self-pay | Admitting: Nurse Practitioner

## 2020-09-03 ENCOUNTER — Other Ambulatory Visit: Payer: Self-pay

## 2020-09-03 ENCOUNTER — Other Ambulatory Visit (HOSPITAL_COMMUNITY)
Admission: RE | Admit: 2020-09-03 | Discharge: 2020-09-03 | Disposition: A | Discharge status: Home / Self Care | Payer: 59 | Source: Ambulatory Visit | Attending: Nurse Practitioner | Admitting: Nurse Practitioner

## 2020-09-03 VITALS — BP 174/98 | HR 89 | Ht 66.0 in | Wt 254.2 lb

## 2020-09-03 DIAGNOSIS — Z01419 Encounter for gynecological examination (general) (routine) without abnormal findings: Secondary | ICD-10-CM | POA: Diagnosis not present

## 2020-09-03 DIAGNOSIS — D649 Anemia, unspecified: Secondary | ICD-10-CM | POA: Diagnosis not present

## 2020-09-03 DIAGNOSIS — R7303 Prediabetes: Secondary | ICD-10-CM

## 2020-09-03 DIAGNOSIS — N920 Excessive and frequent menstruation with regular cycle: Secondary | ICD-10-CM

## 2020-09-03 DIAGNOSIS — E785 Hyperlipidemia, unspecified: Secondary | ICD-10-CM

## 2020-09-03 DIAGNOSIS — D500 Iron deficiency anemia secondary to blood loss (chronic): Secondary | ICD-10-CM | POA: Diagnosis not present

## 2020-09-03 DIAGNOSIS — Z1159 Encounter for screening for other viral diseases: Secondary | ICD-10-CM

## 2020-09-03 MED ORDER — IBUPROFEN 800 MG PO TABS: 800.0000 mg | ORAL_TABLET | Freq: Three times a day (TID) | ORAL | 1 refills | Status: DC | PRN

## 2020-09-03 MED ORDER — FERROUS SULFATE 325 (65 FE) MG PO TABS: ORAL_TABLET | ORAL | 3 refills | Status: DC

## 2020-09-03 NOTE — Progress Notes (Signed)
Assessment & Plan:  Meredith Nichols was seen today for gynecologic exam.  Diagnoses and all orders for this visit:  Well woman exam with routine gynecological exam -     Cytology - PAP -     Cervicovaginal ancillary only  Iron deficiency anemia due to chronic blood loss -     ferrous sulfate 325 (65 FE) MG tablet; TAKE 1 TABLET BY MOUTH EVERY DAY WITH BREAKFAST  Dyslipidemia, goal LDL below 100 -     Lipid panel  Prediabetes -     Hemoglobin A1c -     CMP14+EGFR  Anemia, unspecified type -     CBC with Differential -     CMP14+EGFR  Need for hepatitis C screening test -     HCV Ab w Reflex to Quant PCR  Menorrhagia with regular cycle -     ibuprofen (ADVIL) 800 MG tablet; Take 1 tablet (800 mg total) by mouth every 8 (eight) hours as needed. (Patient not taking: Reported on 09/03/2020)   Patient has been counseled on age-appropriate routine health concerns for screening and prevention. These are reviewed and up-to-date. Referrals have been placed accordingly. Immunizations are up-to-date or declined.    Subjective:   Chief Complaint  Patient presents with   Gynecologic Exam    Gynecologic Exam Pertinent negatives include no abdominal pain, chills, constipation, diarrhea, fever, headaches, nausea, rash or vomiting.  Meredith Nichols 50 y.o. female presents to office today   Review of Systems  Constitutional: Negative.  Negative for chills, fever, malaise/fatigue and weight loss.  HENT: Negative.  Negative for nosebleeds.   Eyes: Negative.  Negative for blurred vision, double vision and photophobia.  Respiratory: Negative.  Negative for cough, sputum production, shortness of breath and wheezing.   Cardiovascular: Negative.  Negative for chest pain, palpitations and leg swelling.  Gastrointestinal: Negative.  Negative for abdominal pain, blood in stool, constipation, diarrhea, heartburn, melena, nausea and vomiting.  Genitourinary:        Menorrhagia  Musculoskeletal:  Negative.  Negative for myalgias.  Skin: Negative.  Negative for rash.  Neurological: Negative.  Negative for dizziness, tremors, speech change, focal weakness, seizures and headaches.  Psychiatric/Behavioral: Negative.  Negative for depression and suicidal ideas. The patient is not nervous/anxious and does not have insomnia.    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   Outpatient Medications Prior to Visit  Medication Sig Dispense Refill   benzonatate (TESSALON) 100 MG capsule Take 100 mg by mouth 3 (three) times daily. (Patient not taking: Reported on 09/03/2020)  ferrous sulfate 325 (65 FE) MG tablet TAKE 1 TABLET BY MOUTH EVERY DAY WITH BREAKFAST 90 tablet 0   ibuprofen (ADVIL) 800 MG tablet Take by mouth.     levocetirizine (XYZAL) 5 MG tablet Take 5 mg by mouth at bedtime. (Patient not taking: Reported on 09/03/2020)     Multiple Vitamins-Minerals (HAIR/SKIN/NAILS/BIOTIN PO) Take by mouth. (Patient not taking: Reported on 09/03/2020)     Multiple Vitamins-Minerals (ONE-A-DAY WOMENS PO) Take by mouth. (Patient not taking: Reported on 09/03/2020)     Facility-Administered Medications Prior to Visit  Medication Dose Route Frequency Provider Last Rate Last Admin   0.9 %  sodium chloride infusion  500 mL Intravenous Once  Mansouraty, Telford Nab., MD        No Known Allergies     Objective:    BP (!) 174/98   Pulse 89   Ht '5\' 6"'  (1.676 m)   Wt 254 lb 3.2 oz (115.3 kg)   SpO2 100%   BMI 41.03 kg/m  Wt Readings from Last 3 Encounters:  09/03/20 254 lb 3.2 oz (115.3 kg)  04/02/20 247 lb (112 kg)  03/19/20 247 lb (112 kg)    Physical Exam Vitals and nursing note reviewed. Exam conducted with a chaperone present.  Constitutional:      General: She is not in acute distress.    Appearance: She is well-developed. She is not diaphoretic.  HENT:     Head: Normocephalic and atraumatic.     Right Ear: Hearing, tympanic membrane, ear canal and external ear normal.     Left Ear: Hearing, tympanic membrane, ear canal and external ear normal.     Nose: Nose normal.     Mouth/Throat:     Pharynx: No oropharyngeal exudate.  Eyes:     General: No scleral icterus.       Right eye: No discharge.        Left eye: No discharge.     Extraocular Movements: Extraocular movements intact.     Conjunctiva/sclera: Conjunctivae normal.     Pupils: Pupils are equal, round, and reactive to light.  Neck:     Thyroid: No thyromegaly.     Trachea: No tracheal deviation.  Cardiovascular:     Rate and Rhythm: Normal rate and regular rhythm.     Heart sounds: Murmur heard.    No friction rub. No gallop.  Pulmonary:     Effort: Pulmonary effort is normal. No tachypnea or respiratory distress.     Breath sounds: Normal breath sounds. No decreased breath sounds, wheezing, rhonchi or rales.  Chest:     Chest wall: No tenderness.  Breasts:    Right: No inverted nipple, mass, nipple discharge, skin change or tenderness.     Left: No inverted nipple, mass, nipple discharge, skin change or tenderness.  Abdominal:     General: Bowel sounds are normal. There is no distension.     Palpations: Abdomen is soft. There is no mass.     Tenderness: There is no abdominal tenderness. There is no guarding or rebound.     Hernia: There  is no hernia in the left inguinal area.  Genitourinary:    Exam position: Lithotomy position.     Labia:        Right: No rash, tenderness, lesion or injury.        Left: No rash, tenderness, lesion or injury.      Vagina: Normal. No vaginal discharge, erythema, tenderness or bleeding.  Cervix: No cervical motion tenderness, discharge or friability.     Uterus: Not tender.      Adnexa:        Right: No mass, tenderness or fullness.         Left: No mass, tenderness or fullness.       Rectum: No mass, anal fissure or external hemorrhoid. Normal anal tone.  Musculoskeletal:        General: No tenderness or deformity. Normal range of motion.     Cervical back: Normal range of motion and neck supple.  Lymphadenopathy:     Cervical: No cervical adenopathy.  Skin:    General: Skin is warm and dry.     Coloration: Skin is not pale.     Findings: No erythema or rash.  Neurological:     Mental Status: She is alert and oriented to person, place, and time.     Cranial Nerves: No cranial nerve deficit.     Coordination: Coordination normal.  Psychiatric:        Behavior: Behavior normal. Behavior is cooperative.        Thought Content: Thought content normal.        Judgment: Judgment normal.         Patient has been counseled extensively about nutrition and exercise as well as the importance of adherence with medications and regular follow-up. The patient was given clear instructions to go to ER or return to medical center if symptoms don't improve, worsen or new problems develop. The patient verbalized understanding.   Follow-up: Return if symptoms worsen or fail to improve.   Gildardo Pounds, FNP-BC [facility] and [place], [place]   09/03/2020, 3:05 PM

## 2020-09-04 LAB — CMP14+EGFR
ALT: 13 IU/L (ref 0–32)
AST: 17 IU/L (ref 0–40)
Albumin/Globulin Ratio: 1.2 (ref 1.2–2.2)
Albumin: 4.1 g/dL (ref 3.8–4.9)
Alkaline Phosphatase: 59 IU/L (ref 44–121)
BUN/Creatinine Ratio: 16 (ref 9–23)
BUN: 9 mg/dL (ref 6–24)
Bilirubin Total: 0.2 mg/dL (ref 0.0–1.2)
CO2: 22 mmol/L (ref 20–29)
Calcium: 9.5 mg/dL (ref 8.7–10.2)
Chloride: 105 mmol/L (ref 96–106)
Creatinine, Ser: 0.57 mg/dL (ref 0.57–1.00)
Globulin, Total: 3.3 g/dL (ref 1.5–4.5)
Glucose: 92 mg/dL (ref 65–99)
Potassium: 3.9 mmol/L (ref 3.5–5.2)
Sodium: 143 mmol/L (ref 134–144)
Total Protein: 7.4 g/dL (ref 6.0–8.5)
eGFR: 110 mL/min/{1.73_m2} (ref >59)

## 2020-09-04 LAB — CBC WITH DIFFERENTIAL/PLATELET
Basophils Absolute: 0 10*3/uL (ref 0.0–0.2)
Basos: 1 %
EOS (ABSOLUTE): 0.3 10*3/uL (ref 0.0–0.4)
Eos: 4 %
Hematocrit: 34.8 % (ref 34.0–46.6)
Hemoglobin: 10.9 g/dL — ABNORMAL LOW (ref 11.1–15.9)
Immature Grans (Abs): 0 10*3/uL (ref 0.0–0.1)
Immature Granulocytes: 1 %
Lymphocytes Absolute: 2.4 10*3/uL (ref 0.7–3.1)
Lymphs: 36 %
MCH: 23.2 pg — ABNORMAL LOW (ref 26.6–33.0)
MCHC: 31.3 g/dL — ABNORMAL LOW (ref 31.5–35.7)
MCV: 74 fL — ABNORMAL LOW (ref 79–97)
Monocytes Absolute: 0.8 10*3/uL (ref 0.1–0.9)
Monocytes: 12 %
Neutrophils Absolute: 3.1 10*3/uL (ref 1.4–7.0)
Neutrophils: 46 %
Platelets: 357 10*3/uL (ref 150–450)
RBC: 4.69 x10E6/uL (ref 3.77–5.28)
RDW: 17 % — ABNORMAL HIGH (ref 11.7–15.4)
WBC: 6.6 10*3/uL (ref 3.4–10.8)

## 2020-09-04 LAB — LIPID PANEL
Chol/HDL Ratio: 4.8 ratio — ABNORMAL HIGH (ref 0.0–4.4)
Cholesterol, Total: 220 mg/dL — ABNORMAL HIGH (ref 100–199)
HDL: 46 mg/dL (ref >39)
LDL Chol Calc (NIH): 143 mg/dL — ABNORMAL HIGH (ref 0–99)
Triglycerides: 170 mg/dL — ABNORMAL HIGH (ref 0–149)
VLDL Cholesterol Cal: 31 mg/dL (ref 5–40)

## 2020-09-04 LAB — HCV INTERPRETATION

## 2020-09-04 LAB — HEMOGLOBIN A1C
Est. average glucose Bld gHb Est-mCnc: 131 mg/dL
Hgb A1c MFr Bld: 6.2 % — ABNORMAL HIGH (ref 4.8–5.6)

## 2020-09-04 LAB — HCV AB W REFLEX TO QUANT PCR: HCV Ab: <0.1 s/co ratio (ref 0.0–0.9)

## 2020-09-06 LAB — CERVICOVAGINAL ANCILLARY ONLY
Bacterial Vaginitis (gardnerella): NEGATIVE
Candida Glabrata: NEGATIVE
Candida Vaginitis: NEGATIVE
Chlamydia: NEGATIVE
Comment: NEGATIVE
Comment: NEGATIVE
Comment: NEGATIVE
Comment: NEGATIVE
Comment: NEGATIVE
Comment: NORMAL
Neisseria Gonorrhea: NEGATIVE
Trichomonas: NEGATIVE

## 2020-09-08 LAB — CYTOLOGY - PAP
Comment: NEGATIVE
Diagnosis: NEGATIVE
Diagnosis: REACTIVE
High risk HPV: NEGATIVE

## 2020-09-29 ENCOUNTER — Other Ambulatory Visit: Payer: Self-pay

## 2020-09-29 ENCOUNTER — Ambulatory Visit: Payer: 59 | Attending: Nurse Practitioner

## 2020-09-29 DIAGNOSIS — Z3042 Encounter for surveillance of injectable contraceptive: Secondary | ICD-10-CM

## 2020-09-30 MED ORDER — MEDROXYPROGESTERONE ACETATE 150 MG/ML IM SUSP: 150.0000 mg | Freq: Once | INTRAMUSCULAR | Status: DC

## 2020-09-30 NOTE — Progress Notes (Signed)
Pt came into the office today for depo injection    No HCG needed    Next depo due Nov 30-Dec 14

## 2020-10-01 ENCOUNTER — Other Ambulatory Visit: Payer: Self-pay | Admitting: Nurse Practitioner

## 2020-10-01 ENCOUNTER — Encounter: Payer: Self-pay | Admitting: Nurse Practitioner

## 2020-10-01 MED ORDER — NAPROXEN 500 MG PO TABS: 500.0000 mg | ORAL_TABLET | Freq: Two times a day (BID) | ORAL | 1 refills | Status: DC

## 2020-10-03 ENCOUNTER — Other Ambulatory Visit: Payer: Self-pay | Admitting: Nurse Practitioner

## 2020-10-03 MED ORDER — METHOCARBAMOL 500 MG PO TABS: 500.0000 mg | ORAL_TABLET | Freq: Three times a day (TID) | ORAL | 1 refills | Status: DC | PRN

## 2020-12-15 ENCOUNTER — Ambulatory Visit: Payer: 59

## 2020-12-16 ENCOUNTER — Other Ambulatory Visit: Payer: Self-pay

## 2020-12-16 ENCOUNTER — Ambulatory Visit: Payer: 59 | Attending: Nurse Practitioner

## 2020-12-16 DIAGNOSIS — Z3042 Encounter for surveillance of injectable contraceptive: Secondary | ICD-10-CM

## 2020-12-16 MED ORDER — MEDROXYPROGESTERONE ACETATE 150 MG/ML IM SUSP
150.0000 mg | Freq: Once | INTRAMUSCULAR | Status: AC
  Administered 2020-12-16: 150 mg via INTRAMUSCULAR

## 2020-12-16 NOTE — Progress Notes (Signed)
Patient came into office today for a depo injection   No HCG needed  Next depo due Feb. 16-Mar. 02

## 2020-12-30 ENCOUNTER — Other Ambulatory Visit: Payer: Self-pay | Admitting: Nurse Practitioner

## 2021-01-24 ENCOUNTER — Other Ambulatory Visit: Payer: Self-pay | Admitting: Nurse Practitioner

## 2021-01-24 NOTE — Telephone Encounter (Signed)
Requested Prescriptions  Pending Prescriptions Disp Refills   naproxen (NAPROSYN) 500 MG tablet [Pharmacy Med Name: NAPROXEN 500 MG TABLET] 60 tablet 1    Sig: TAKE 1 TABLET BY MOUTH 2 TIMES DAILY WITH A MEAL.     Analgesics:  NSAIDS Failed - 01/24/2021 12:06 AM      Failed - HGB in normal range and within 360 days    Hemoglobin  Date Value Ref Range Status  09/03/2020 10.9 (L) 11.1 - 15.9 g/dL Final         Passed - Cr in normal range and within 360 days    Creatinine, Ser  Date Value Ref Range Status  09/03/2020 0.57 0.57 - 1.00 mg/dL Final         Passed - Patient is not pregnant      Passed - Valid encounter within last 12 months    Recent Outpatient Visits          4 months ago Well woman exam with routine gynecological exam   [place], Vernia Buff, NP   1 year ago Abnormal uterine bleeding (AUB)   [place], [place] W, NP   3 years ago Iron deficiency anemia due to chronic blood loss   [place], [place] W, NP   3 years ago Pap smear for cervical cancer screening   [place], Vernia Buff, NP   3 years ago Chronic pain of right knee   [place], Vernia Buff, NP

## 2021-03-03 ENCOUNTER — Ambulatory Visit: Payer: 59 | Attending: Nurse Practitioner

## 2021-03-03 ENCOUNTER — Other Ambulatory Visit: Payer: Self-pay

## 2021-03-03 DIAGNOSIS — Z3042 Encounter for surveillance of injectable contraceptive: Secondary | ICD-10-CM | POA: Diagnosis not present

## 2021-03-03 MED ORDER — MEDROXYPROGESTERONE ACETATE 150 MG/ML IM SUSP
150.0000 mg | Freq: Once | INTRAMUSCULAR | Status: AC
  Administered 2021-03-03: 150 mg via INTRAMUSCULAR

## 2021-03-03 NOTE — Progress Notes (Signed)
Pt arrived for depo injection Pt on time for injection No urine HCG needed Pt given injection in left VENTROGLUTEAL Pt tolerated injection well Pt to return MAY 4-MAY 18

## 2021-03-15 ENCOUNTER — Encounter: Payer: Self-pay | Admitting: Emergency Medicine

## 2021-03-15 ENCOUNTER — Ambulatory Visit
Admission: EM | Admit: 2021-03-15 | Discharge: 2021-03-15 | Disposition: A | Discharge status: Home / Self Care | Payer: 59 | Attending: Physician Assistant | Admitting: Physician Assistant

## 2021-03-15 ENCOUNTER — Ambulatory Visit (INDEPENDENT_AMBULATORY_CARE_PROVIDER_SITE_OTHER): Payer: 59

## 2021-03-15 DIAGNOSIS — M25562 Pain in left knee: Secondary | ICD-10-CM | POA: Diagnosis not present

## 2021-03-15 DIAGNOSIS — M1712 Unilateral primary osteoarthritis, left knee: Secondary | ICD-10-CM | POA: Diagnosis not present

## 2021-03-15 MED ORDER — PREDNISONE 20 MG PO TABS: 40.0000 mg | ORAL_TABLET | Freq: Every day | ORAL | 0 refills | Status: AC

## 2021-03-15 NOTE — ED Provider Notes (Signed)
[facility]    CSN: [id number] Arrival date & time: 03/15/21  1542      History   Chief Complaint Chief Complaint  Patient presents with   Knee Pain    HPI Meredith Nichols is a 51 y.o. female.   Patient here today for evaluation of left knee pain that she has had over the last year.  She denies any known injury.  She reports that pain seems to be worsening with time.  Weightbearing worsens pain.  She has been taking naproxen without resolution.  The history is provided by the patient.  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    Diabetes mellitus without complication ([place])    pre DM- no meds    GERD (gastroesophageal reflux disease)    Heart murmur     Patient Active Problem List   Diagnosis Date Noted   Menorrhagia with regular cycle 10/18/2017   Anemia 10/18/2017   History of diabetes mellitus, type II 02/05/2017    Past Surgical History:  Procedure Laterality Date   c sections     CESAREAN SECTION     2 previous   WISDOM TOOTH EXTRACTION     1993,2001    OB History     Gravida  3   Para      Term      Preterm      AB      Living  3      SAB      IAB      Ectopic      Multiple      Live Births  3            Home Medications    Prior to Admission medications   Medication Sig Start Date End Date Taking? Authorizing Provider  naproxen (NAPROSYN) 500 MG tablet TAKE 1 TABLET BY MOUTH 2 TIMES DAILY WITH A MEAL. 01/24/21  Yes Gildardo Pounds, NP  predniSONE (DELTASONE) 20 MG tablet Take 2 tablets (40 mg total) by mouth daily with breakfast for 5 days. 03/15/21 03/20/21 Yes Francene Finders, PA-C  ferrous sulfate 325 (65 FE) MG tablet TAKE 1 TABLET BY MOUTH EVERY DAY WITH BREAKFAST 09/03/20   Gildardo Pounds, NP  methocarbamol (ROBAXIN) 500 MG tablet Take 1 tablet (500 mg total) by mouth every 8 (eight) hours as needed for  muscle spasms. 10/03/20   Gildardo Pounds, NP    Family History Family History  Problem Relation Age of Onset   Hypertension Mother    Diabetes Mother    Kidney disease Mother    Diabetes Brother    Colon cancer Neg Hx    Colon polyps Neg Hx    Esophageal cancer Neg Hx    Rectal cancer Neg Hx    Stomach cancer Neg Hx     Social History Social History   Tobacco Use   Smoking status: Never   Smokeless tobacco: Never  Vaping Use   Vaping Use: Never used  Substance Use Topics   Alcohol use: No   Drug use: No     Allergies   Patient has no known allergies.   Review of Systems Review of Systems  Constitutional:  Negative for chills and fever.  Eyes:  Negative for discharge and redness.  Gastrointestinal:  Negative for abdominal pain, nausea  and vomiting.  Musculoskeletal:  Positive for arthralgias and joint swelling.    Physical Exam Triage Vital Signs ED Triage Vitals [03/15/21 1551]  Enc Vitals Group     BP (!) 157/90     Pulse Rate 89     Resp      Temp 99.7 F (37.6 C)     Temp Source Oral     SpO2 98 %     Weight      Height      Head Circumference      Peak Flow      Pain Score      Pain Loc      Pain Edu?      Excl. in [place]?    No data found.  Updated Vital Signs BP (!) 157/90 (BP Location: Left Arm)    Pulse 89    Temp 99.7 F (37.6 C) (Oral)    Ht 5\' 6"  (1.676 m)    Wt 251 lb (113.9 kg)    SpO2 98%    BMI 40.51 kg/m      Physical Exam Vitals and nursing note reviewed.  Constitutional:      General: She is not in acute distress.    Appearance: Normal appearance. She is not ill-appearing.  HENT:     Head: Normocephalic and atraumatic.  Eyes:     Conjunctiva/sclera: Conjunctivae normal.  Cardiovascular:     Rate and Rhythm: Normal rate.  Pulmonary:     Effort: Pulmonary effort is normal.  Musculoskeletal:     Comments: Full range of motion of left knee  Neurological:     Mental Status: She is alert.  Psychiatric:        Mood and  Affect: Mood normal.        Behavior: Behavior normal.        Thought Content: Thought content normal.     UC Treatments / Results  Labs (all labs ordered are listed, but only abnormal results are displayed) Labs Reviewed - No data to display  EKG   Radiology DG Knee Complete 4 Views Left  Result Date: 03/15/2021 CLINICAL DATA:  Left knee pain. EXAM: LEFT KNEE - COMPLETE 4+ VIEW COMPARISON:  None. FINDINGS: Significant age advanced tricompartmental degenerative changes with joint space narrowing, osteophytic spurring and subchondral cystic change. No acute bony findings or osteochondral lesion. No joint effusion. 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 Electronically Signed   By: Marijo Sanes M.D.   On: 03/15/2021 16:16    Procedures Procedures (including critical care time)  Medications Ordered in UC Medications - No data to display  Initial Impression / Assessment and Plan / UC Course  I have reviewed the triage vital signs and the nursing notes.  Pertinent labs & imaging results that were available during my care of the patient were reviewed by me and considered in my medical decision making (see chart for details).    X-ray with significant degenerative changes.  Will treat with steroids and recommended follow-up with Ortho.  Patient expresses understanding.  Final Clinical Impressions(s) / UC Diagnoses   Final diagnoses:  Arthritis of left knee   Discharge Instructions   None    ED Prescriptions     Medication Sig Dispense Auth. Provider   predniSONE (DELTASONE) 20 MG tablet Take 2 tablets (40 mg total) by mouth daily with breakfast for 5 days. 10 tablet Francene Finders, PA-C      PDMP not reviewed this encounter.   Doyle Askew,  Sallee Lange, PA-C 03/15/21 1712

## 2021-03-15 NOTE — ED Triage Notes (Signed)
Patient c/o left knee pain x 1 year, no apparent injury.  Patient has been taken Naproxen for it.

## 2021-03-22 ENCOUNTER — Telehealth: Payer: Self-pay | Admitting: Nurse Practitioner

## 2021-03-22 NOTE — Telephone Encounter (Signed)
Called pt not able to leave a vm due to the mailbox being full.  ?

## 2021-03-22 NOTE — Telephone Encounter (Signed)
Copied from [place] [id number]. Topic: Referral - Request for Referral ?>> Mar 21, 2021  2:54 PM Tessa Lerner A wrote: ?Has patient seen PCP for this complaint? Yes.   ?*If NO, is insurance requiring patient see PCP for this issue before PCP can refer them? ?Referral for which specialty: Orthopedic Specialist  ?Preferred provider/office: Patient would like for them to accept [employer]  ?Reason for referral: left knee discomfort ?

## 2021-03-25 ENCOUNTER — Ambulatory Visit: Payer: 59 | Admitting: Physician Assistant

## 2021-03-25 ENCOUNTER — Other Ambulatory Visit: Payer: Self-pay

## 2021-03-25 DIAGNOSIS — G8929 Other chronic pain: Secondary | ICD-10-CM

## 2021-03-25 DIAGNOSIS — M25562 Pain in left knee: Secondary | ICD-10-CM

## 2021-03-25 MED ORDER — METHYLPREDNISOLONE ACETATE 40 MG/ML IJ SUSP
80.0000 mg | INTRAMUSCULAR | Status: AC | PRN
  Administered 2021-03-25: 80 mg via INTRA_ARTICULAR

## 2021-03-25 MED ORDER — LIDOCAINE HCL 1 % IJ SOLN
5.0000 mL | INTRAMUSCULAR | Status: AC | PRN
  Administered 2021-03-25: 5 mL

## 2021-03-25 NOTE — Progress Notes (Signed)
? ?Office Visit Note ?  ?Patient: Meredith Nichols           ?Date of Birth: 08-24-1970           ?MRN: [id number] ?Visit Date:  ?             ?Requested by: Gildardo Pounds, NP ?[address] ?[place],  [place] [address] ?PCP: Gildardo Pounds, NP ? ? ?Assessment & Plan: ?Visit Diagnoses:  ?1. Chronic pain of left knee   ? ? ?Plan: Patient has varus arthritis of her left knee.  We discussed the natural history of this.  We will go forward with an injection today.  Given her information about close chain knee strengthening exercises we will switch her anti-inflammatory to meloxicam she understands not to take this with other anti-inflammatories.  Given her information about Voltaren gel she will give this 6 weeks see how she does ? ?Follow-Up Instructions: No follow-ups on file.  ? ?Orders:  ?Orders Placed This Encounter  ?Procedures  ? Large Joint Inj: L knee  ? ?No orders of the defined types were placed in this encounter. ? ? ? ? Procedures: ?No procedures performed ? ? ?Clinical Data: ?No additional findings. ? ? ?Subjective: ?Chief Complaint  ?Patient presents with  ? Left Knee - Pain  ? ? ?HPI patient is a pleasant 50 year old woman with a long history of left knee pain.  Denies any injury denies any surgeries.  She has been treating this with oral anti-inflammatories.  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  She denies any fever or chills.  She does have some fluid in the knee recently was given oral prednisone which seemed to help quite a bit ? ?Review of Systems  ?All other systems reviewed and are negative. ? ? ?Objective: ?Vital Signs: There were no vitals taken for this visit. ? ?Physical Exam patient appears well normal respiratory effort appropriate to exam ? ?Ortho Exam left knee no effusion redness or cellulitis mild soft tissue swelling she has crepitus with range of motion tender globally over the medial lateral joint line ?Specialty Comments:   ?No specialty comments available. ? ?Imaging: ?No results found. ? ? ?PMFS History: ?Patient Active Problem List  ? Diagnosis Date Noted  ? Menorrhagia with regular cycle 10/18/2017  ? Anemia 10/18/2017  ? History of diabetes mellitus, type II 02/05/2017  ? ?Past Medical History:  ?Diagnosis Date  ? Allergy   ? Anemia   ? Arthritis   ? 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 Diabetes mellitus without complication ([place])   ? pre DM- no meds   ? GERD (gastroesophageal reflux disease)   ? Heart murmur   ?  ?Family History  ?Problem Relation Age of Onset  ? Hypertension Mother   ? Diabetes Mother   ? Kidney disease Mother   ? Diabetes Brother   ? Colon cancer Neg Hx   ? Colon polyps Neg Hx   ? Esophageal cancer Neg Hx   ? Rectal cancer Neg Hx   ? Stomach cancer Neg Hx   ?  ?Past Surgical History:  ?Procedure Laterality Date  ? c sections    ? CESAREAN SECTION    ? 2 previous  ? WISDOM TOOTH EXTRACTION    ? [id number]  ? ?Social History  ? ?Occupational History  ? Not on file  ?Tobacco Use  ? Smoking status: Never  ? Smokeless tobacco: Never  ?  Vaping Use  ? Vaping Use: Never used  ?Substance and Sexual Activity  ? Alcohol use: No  ? Drug use: No  ? Sexual activity: Never  ? ? ? ? ? ? ?

## 2021-03-25 NOTE — Progress Notes (Signed)
? ?Office Visit Note ?  ?Patient: Meredith Nichols           ?Date of Birth: 1970/07/21           ?MRN: [id number] ?Visit Date: 03/25/2021 ?             ?Requested by: Gildardo Pounds, NP ?[address] ?[place],  [place] [address] ?PCP: Gildardo Pounds, NP ? ?Chief Complaint  ?Patient presents with  ? Left Knee - Pain  ? ? ? ? ?HPI: ?Patient is a very pleasant active 51 year old woman with a chief complaint of left knee pain.  She had this on and off for a while but it seems to be getting worse.  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  No prior history of knee surgery or trauma.  Feels better when she rests.  She currently takes ibuprofen and naproxen helping to get it better and it does help. ? ?Assessment & Plan: ?Visit Diagnoses: No diagnosis found. ? ?Plan: Left knee tricompartmental arthritis.  I reviewed the x-rays she had done and she is got varus arthritis with joint space narrowing sclerotic changes she has significant osteophytes off the patella and femur as well as the medial joint.  There were no acute bony changes.  We discussed the natural history of this.  She has not tried an injection I think that might help her quite a bit we will go forward with that today.  We will also change her anti-inflammatory to meloxicam 15 mg daily with food.  She understands to discontinue the other anti-inflammatories while taking this.  I have also given her exercises for close chain quadricep strengthening.  Also given her information about Voltaren gel topically.  She will give this about 6 weeks and follow-up if needed if she has a significant increase in pain she will contact us ? ?Follow-Up Instructions: No follow-ups on file.  ? ?Ortho Exam ? ?Patient is alert, oriented, no adenopathy, well-dressed, normal affect, normal respiratory effort. ?Examination of her knee no effusion mild soft tissue swelling.  She has good varus valgus stability good endpoint on Lachman  testing.  She has global tenderness with weightbearing more than with just sitting.  Compartments are soft and nontender no redness no cellulitis ? ?Imaging: ?No results found. ?No images are attached to the encounter. ? ?Labs: ?Lab Results  ?Component Value Date  ? HGBA1C 6.2 (H) 09/03/2020  ? HGBA1C 6.0 (H) 03/12/2019  ? HGBA1C 5.4 02/05/2017  ? REPTSTATUS 07/23/2009 FINAL 07/20/2009  ? CULT Herpes Simplex Type 1 detected. 07/20/2009  ? ? ? ?Lab Results  ?Component Value Date  ? ALBUMIN 4.1 09/03/2020  ? ALBUMIN 3.8 03/12/2019  ? ALBUMIN 4.2 11/12/2007  ? ? ?No results found for: MG ?Lab Results  ?Component Value Date  ? VD25OH 21.0 (L) 03/12/2019  ? VD25OH 14.0 (L) 02/05/2017  ? ? ?No results found for: PREALBUMIN ?CBC EXTENDED Latest Ref Rng & Units 09/03/2020 03/12/2019 10/18/2017  ?WBC 3.4 - 10.8 x10E3/uL 6.6 8.1 7.2  ?RBC 3.77 - 5.28 x10E6/uL 4.69 3.17(L) 3.50(L)  ?HGB 11.1 - 15.9 g/dL 10.9(L) 8.7(L) 7.3(L)  ?HCT 34.0 - 46.6 % 34.8 27.2(L) 26.4(L)  ?PLT 150 - 450 x10E3/uL 357 302 389  ?NEUTROABS 1.4 - 7.0 x10E3/uL 3.1 - -  ?LYMPHSABS 0.7 - 3.1 x10E3/uL 2.4 - -  ? ? ? ?There is no height or weight on file to calculate BMI. ? ?Orders:  ?No orders  of the defined types were placed in this encounter. ? ?No orders of the defined types were placed in this encounter. ? ? ? Procedures: ?Large Joint Inj: L knee on 03/25/2021 3:35 PM ?Indications: pain and diagnostic evaluation ?Details: 25 G 1.5 in needle, anteromedial approach ? ?Arthrogram: No ? ?Medications: 80 mg methylPREDNISolone acetate 40 MG/ML; 5 mL lidocaine 1 % ?Outcome: tolerated well, no immediate complications ?Procedure, treatment alternatives, risks and benefits explained, specific risks discussed. Consent was given by the patient.  ? ? ? ?Clinical Data: ?No additional findings. ? ?ROS: ? ?All other systems negative, except as noted in the HPI. ?Review of Systems  ?All other systems reviewed and are negative. ? ?Objective: ?Vital Signs: There were no vitals  taken for this visit. ? ?Specialty Comments:  ?No specialty comments available. ? ?PMFS History: ?Patient Active Problem List  ? Diagnosis Date Noted  ? Menorrhagia with regular cycle 10/18/2017  ? Anemia 10/18/2017  ? History of diabetes mellitus, type II 02/05/2017  ? ?Past Medical History:  ?Diagnosis Date  ? Allergy   ? Anemia   ? Arthritis   ? 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 Diabetes mellitus without complication ([place])   ? pre DM- no meds   ? GERD (gastroesophageal reflux disease)   ? Heart murmur   ?  ?Family History  ?Problem Relation Age of Onset  ? Hypertension Mother   ? Diabetes Mother   ? Kidney disease Mother   ? Diabetes Brother   ? Colon cancer Neg Hx   ? Colon polyps Neg Hx   ? Esophageal cancer Neg Hx   ? Rectal cancer Neg Hx   ? Stomach cancer Neg Hx   ?  ?Past Surgical History:  ?Procedure Laterality Date  ? c sections    ? CESAREAN SECTION    ? 2 previous  ? WISDOM TOOTH EXTRACTION    ? 3007,6226  ? ?Social History  ? ?Occupational History  ? Not on file  ?Tobacco Use  ? Smoking status: Never  ? Smokeless tobacco: Never  ?Vaping Use  ? Vaping Use: Never used  ?Substance and Sexual Activity  ? Alcohol use: No  ? Drug use: No  ? Sexual activity: Never  ? ? ? ? ? ?

## 2021-05-19 ENCOUNTER — Other Ambulatory Visit: Payer: Self-pay | Admitting: Nurse Practitioner

## 2021-05-19 ENCOUNTER — Telehealth: Payer: Self-pay | Admitting: Physician Assistant

## 2021-05-19 ENCOUNTER — Ambulatory Visit: Payer: 59 | Attending: Nurse Practitioner

## 2021-05-19 ENCOUNTER — Encounter: Payer: Self-pay | Admitting: Nurse Practitioner

## 2021-05-19 DIAGNOSIS — Z3042 Encounter for surveillance of injectable contraceptive: Secondary | ICD-10-CM | POA: Diagnosis not present

## 2021-05-19 DIAGNOSIS — D500 Iron deficiency anemia secondary to blood loss (chronic): Secondary | ICD-10-CM

## 2021-05-19 MED ORDER — FERROUS SULFATE 325 (65 FE) MG PO TABS: ORAL_TABLET | ORAL | 3 refills | Status: DC

## 2021-05-19 MED ORDER — MEDROXYPROGESTERONE ACETATE 150 MG/ML IM SUSP
150.0000 mg | Freq: Once | INTRAMUSCULAR | Status: AC
  Administered 2021-05-19: 150 mg via INTRAMUSCULAR

## 2021-05-19 NOTE — Telephone Encounter (Signed)
Pt is wanting to know when they can have the next injection  ?

## 2021-05-19 NOTE — Progress Notes (Signed)
Pt arrived for depo injection ?Pt on time for injection No urine HCG needed ?Pt given injection in Right Ventrogluteal ?Pt tolerated injection well ?Pt to return Jul-20-Aug 3 ?

## 2021-05-20 ENCOUNTER — Telehealth: Payer: Self-pay

## 2021-05-20 ENCOUNTER — Ambulatory Visit: Payer: 59

## 2021-05-20 NOTE — Telephone Encounter (Signed)
Please precert for bilateral visco.  ?Thanks-Tayllor Breitenstein ?

## 2021-05-20 NOTE — Telephone Encounter (Signed)
Called patient. She will come get the handicap application.  ?

## 2021-05-27 NOTE — Telephone Encounter (Signed)
Noted  

## 2021-08-17 ENCOUNTER — Encounter: Payer: Self-pay | Admitting: Nurse Practitioner

## 2021-08-17 ENCOUNTER — Ambulatory Visit: Payer: 59 | Attending: Nurse Practitioner | Admitting: Nurse Practitioner

## 2021-08-17 ENCOUNTER — Ambulatory Visit: Payer: 59

## 2021-08-17 VITALS — BP 149/82 | HR 77 | Temp 98.8°F | Ht 66.0 in | Wt 253.2 lb

## 2021-08-17 DIAGNOSIS — Z3042 Encounter for surveillance of injectable contraceptive: Secondary | ICD-10-CM

## 2021-08-17 DIAGNOSIS — Z862 Personal history of diseases of the blood and blood-forming organs and certain disorders involving the immune mechanism: Secondary | ICD-10-CM

## 2021-08-17 DIAGNOSIS — Z0000 Encounter for general adult medical examination without abnormal findings: Secondary | ICD-10-CM

## 2021-08-17 DIAGNOSIS — R7303 Prediabetes: Secondary | ICD-10-CM

## 2021-08-17 DIAGNOSIS — I10 Essential (primary) hypertension: Secondary | ICD-10-CM

## 2021-08-17 DIAGNOSIS — Z6841 Body Mass Index (BMI) 40.0 and over, adult: Secondary | ICD-10-CM

## 2021-08-17 DIAGNOSIS — E785 Hyperlipidemia, unspecified: Secondary | ICD-10-CM

## 2021-08-17 DIAGNOSIS — E6609 Other obesity due to excess calories: Secondary | ICD-10-CM

## 2021-08-17 DIAGNOSIS — Z0001 Encounter for general adult medical examination with abnormal findings: Secondary | ICD-10-CM | POA: Diagnosis not present

## 2021-08-17 MED ORDER — CHLORTHALIDONE 25 MG PO TABS
25.0000 mg | ORAL_TABLET | Freq: Every day | ORAL | 1 refills | Status: DC
Start: 2021-08-17 — End: 2022-03-08

## 2021-08-17 MED ORDER — MEDROXYPROGESTERONE ACETATE 150 MG/ML IM SUSP
150.0000 mg | Freq: Once | INTRAMUSCULAR | Status: AC
  Administered 2021-08-17: 150 mg via INTRAMUSCULAR

## 2021-08-17 NOTE — Progress Notes (Signed)
Assessment & Plan:  Meredith Nichols was seen today for annual exam.  Diagnoses and all orders for this visit:  Encounter for annual physical exam  Prediabetes -     CMP14+EGFR -     Hemoglobin A1c  Dyslipidemia, goal LDL below 100 -     Lipid panel  History of anemia -     CBC with Differential  Encounter for surveillance of injectable contraceptive -     medroxyPROGESTERone (DEPO-PROVERA) injection 150 mg  Primary hypertension -     chlorthalidone (HYGROTON) 25 MG tablet; Take 1 tablet (25 mg total) by mouth daily. Continue all antihypertensives as prescribed.  Reminded to bring in blood pressure log for follow  up appointment.  RECOMMENDATIONS: DASH/Mediterranean Diets are healthier choices for HTN.      Patient has been counseled on age-appropriate routine health concerns for screening and prevention. These are reviewed and up-to-date. Referrals have been placed accordingly. Immunizations are up-to-date or declined.    Subjective:   Chief Complaint  Patient presents with   Annual Exam   HPI Meredith Nichols 51 y.o. female presents to office today for annual physical  and Depo provera injection (in date for administration today).   Blood pressure is elevated today. It has been elevated for quite some time when I reviewed her records. Will start chlorthalidone 25 mg daily today.  BP Readings from Last 3 Encounters:  08/17/21 (!) 149/82  03/15/21 (!) 157/90  09/03/20 (!) 174/98     Review of Systems  Constitutional:  Negative for fever, malaise/fatigue and weight loss.  HENT: Negative.  Negative for nosebleeds.   Eyes: Negative.  Negative for blurred vision, double vision and photophobia.  Respiratory: Negative.  Negative for cough and shortness of breath.   Cardiovascular: Negative.  Negative for chest pain, palpitations and leg swelling.  Gastrointestinal: Negative.  Negative for heartburn, nausea and vomiting.  Genitourinary: Negative.   Musculoskeletal: Negative.   Negative for myalgias.  Skin: Negative.   Neurological: Negative.  Negative for dizziness, focal weakness, seizures and headaches.  Endo/Heme/Allergies: Negative.   Psychiatric/Behavioral: Negative.  Negative for suicidal ideas.     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   Outpatient Medications Prior to Visit  Medication Sig Dispense Refill   ferrous sulfate 325 (65 FE) MG tablet TAKE 1 TABLET BY MOUTH EVERY DAY WITH BREAKFAST 90 tablet 3   methocarbamol (ROBAXIN) 500 MG tablet Take 1 tablet (500 mg total) by mouth every 8 (eight) hours as needed for muscle spasms. (Patient not taking: Reported on 08/17/2021) 60 tablet  1   Facility-Administered Medications Prior to Visit  Medication Dose Route Frequency Provider Last Rate Last Admin   0.9 %  sodium chloride infusion  500 mL Intravenous Once Mansouraty, Telford Nab., MD       medroxyPROGESTERone (DEPO-PROVERA) injection 150 mg  150 mg Intramuscular Once Gildardo Pounds, NP        No Known Allergies     Objective:    BP (!) 149/82   Pulse 77   Temp 98.8 F (37.1 C) (Oral)   Ht '5\' 6"'  (1.676 m)   Wt 253 lb 3.2 oz (114.9 kg)   SpO2 99%   BMI 40.87 kg/m  Wt Readings from Last 3 Encounters:  08/17/21 253 lb 3.2 oz (114.9 kg)  03/15/21 251 lb (113.9 kg)  09/03/20 254 lb  3.2 oz (115.3 kg)    Physical Exam Constitutional:      Appearance: She is well-developed.  HENT:     Head: Normocephalic and atraumatic.     Right Ear: Hearing, tympanic membrane, ear canal and external ear normal.     Left Ear: Hearing, tympanic membrane, ear canal and external ear normal.     Nose: Nose normal.     Right Turbinates: Not enlarged.     Left Turbinates: Not enlarged.     Mouth/Throat:     Lips: Pink.     Mouth: Mucous membranes are moist.     Dentition: No dental tenderness, gingival swelling, dental abscesses or gum lesions.     Pharynx: No oropharyngeal exudate.  Eyes:     General: No scleral icterus.       Right eye: No discharge.     Extraocular Movements: Extraocular movements intact.     Conjunctiva/sclera: Conjunctivae normal.     Pupils: Pupils are equal, round, and reactive to light.  Neck:     Thyroid: No thyromegaly.     Trachea: No tracheal deviation.  Cardiovascular:     Rate and Rhythm: Normal rate and regular rhythm.     Heart sounds: Murmur heard.     No friction rub.  Pulmonary:     Effort: Pulmonary effort is normal. No accessory muscle usage or respiratory distress.     Breath sounds: Normal breath sounds. No decreased breath sounds, wheezing, rhonchi or rales.  Abdominal:     General: Bowel sounds are normal. There is no distension.     Palpations: Abdomen is soft. There is no mass.     Tenderness: There is no abdominal tenderness. There is no right CVA tenderness, left CVA tenderness, guarding or rebound.     Hernia: No hernia is present.  Musculoskeletal:        General: No tenderness or deformity. Normal range of motion.     Cervical back: Normal range of motion and neck supple.  Lymphadenopathy:     Cervical: No cervical adenopathy.  Skin:    General: Skin is warm and dry.     Findings: No erythema.  Neurological:     Mental Status: She is alert and oriented to person, place, and time.     Cranial Nerves: No cranial nerve  deficit.     Motor: Motor function is intact.     Coordination: Coordination is intact. Coordination normal.     Gait: Gait is intact.     Deep Tendon Reflexes:     Reflex Scores:      Patellar reflexes are 1+ on the right side and 1+ on the left side. Psychiatric:  Attention and Perception: Attention normal.        Mood and Affect: Mood normal.        Speech: Speech normal.        Behavior: Behavior normal.        Thought Content: Thought content normal.        Judgment: Judgment normal.          Patient has been counseled extensively about nutrition and exercise as well as the importance of adherence with medications and regular follow-up. The patient was given clear instructions to go to ER or return to medical center if symptoms don't improve, worsen or new problems develop. The patient verbalized understanding.   Follow-up: Return in 4 weeks (on 09/14/2021), or if symptoms worsen or fail to improve, for BMP. 3 months BP check.   Gildardo Pounds, FNP-BC [facility] and [facility] [place], [place]   08/17/2021, 11:51 AM

## 2021-08-17 NOTE — Progress Notes (Signed)
Pt arrived for depo injection Pt on time for injection No urine HCG needed Pt given injection in Left Ventragluteal Pt tolerated injection well Pt to return OCT-18- NOV 1

## 2021-08-18 LAB — CBC WITH DIFFERENTIAL/PLATELET
Basophils Absolute: 0 10*3/uL (ref 0.0–0.2)
Basos: 1 %
EOS (ABSOLUTE): 0.2 10*3/uL (ref 0.0–0.4)
Eos: 3 %
Hematocrit: 31.9 % — ABNORMAL LOW (ref 34.0–46.6)
Hemoglobin: 9.8 g/dL — ABNORMAL LOW (ref 11.1–15.9)
Immature Grans (Abs): 0 10*3/uL (ref 0.0–0.1)
Immature Granulocytes: 1 %
Lymphocytes Absolute: 1.9 10*3/uL (ref 0.7–3.1)
Lymphs: 30 %
MCH: 22.6 pg — ABNORMAL LOW (ref 26.6–33.0)
MCHC: 30.7 g/dL — ABNORMAL LOW (ref 31.5–35.7)
MCV: 74 fL — ABNORMAL LOW (ref 79–97)
Monocytes Absolute: 0.7 10*3/uL (ref 0.1–0.9)
Monocytes: 10 %
Neutrophils Absolute: 3.5 10*3/uL (ref 1.4–7.0)
Neutrophils: 55 %
Platelets: 327 10*3/uL (ref 150–450)
RBC: 4.33 x10E6/uL (ref 3.77–5.28)
RDW: 16 % — ABNORMAL HIGH (ref 11.7–15.4)
WBC: 6.3 10*3/uL (ref 3.4–10.8)

## 2021-08-18 LAB — CMP14+EGFR
ALT: 7 IU/L (ref 0–32)
AST: 12 IU/L (ref 0–40)
Albumin/Globulin Ratio: 1.4 (ref 1.2–2.2)
Albumin: 4.1 g/dL (ref 3.8–4.9)
Alkaline Phosphatase: 61 IU/L (ref 44–121)
BUN/Creatinine Ratio: 10 (ref 9–23)
BUN: 7 mg/dL (ref 6–24)
Bilirubin Total: 0.4 mg/dL (ref 0.0–1.2)
CO2: 21 mmol/L (ref 20–29)
Calcium: 9.4 mg/dL (ref 8.7–10.2)
Chloride: 103 mmol/L (ref 96–106)
Creatinine, Ser: 0.67 mg/dL (ref 0.57–1.00)
Globulin, Total: 2.9 g/dL (ref 1.5–4.5)
Glucose: 110 mg/dL — ABNORMAL HIGH (ref 70–99)
Potassium: 4.3 mmol/L (ref 3.5–5.2)
Sodium: 139 mmol/L (ref 134–144)
Total Protein: 7 g/dL (ref 6.0–8.5)
eGFR: 105 mL/min/{1.73_m2} (ref >59)

## 2021-08-18 LAB — LIPID PANEL
Chol/HDL Ratio: 4.3 ratio (ref 0.0–4.4)
Cholesterol, Total: 196 mg/dL (ref 100–199)
HDL: 46 mg/dL (ref >39)
LDL Chol Calc (NIH): 133 mg/dL — ABNORMAL HIGH (ref 0–99)
Triglycerides: 91 mg/dL (ref 0–149)
VLDL Cholesterol Cal: 17 mg/dL (ref 5–40)

## 2021-08-18 LAB — HEMOGLOBIN A1C
Est. average glucose Bld gHb Est-mCnc: 146 mg/dL
Hgb A1c MFr Bld: 6.7 % — ABNORMAL HIGH (ref 4.8–5.6)

## 2021-08-21 ENCOUNTER — Other Ambulatory Visit: Payer: Self-pay | Admitting: Nurse Practitioner

## 2021-08-21 DIAGNOSIS — D259 Leiomyoma of uterus, unspecified: Secondary | ICD-10-CM

## 2021-08-21 DIAGNOSIS — D500 Iron deficiency anemia secondary to blood loss (chronic): Secondary | ICD-10-CM

## 2021-08-21 DIAGNOSIS — N920 Excessive and frequent menstruation with regular cycle: Secondary | ICD-10-CM

## 2021-08-21 MED ORDER — PRAVASTATIN SODIUM 20 MG PO TABS: 20.0000 mg | ORAL_TABLET | Freq: Every day | ORAL | 3 refills | Status: DC

## 2021-08-21 MED ORDER — FERROUS SULFATE 325 (65 FE) MG PO TABS: 325.0000 mg | ORAL_TABLET | Freq: Two times a day (BID) | ORAL | 3 refills | Status: DC

## 2021-11-14 ENCOUNTER — Ambulatory Visit: Payer: Self-pay

## 2021-11-14 NOTE — Telephone Encounter (Signed)
  Chief Complaint: Foot and ankle pain Symptoms: Rt foot and left ankle are painful. Mild swelling Frequency: 1 month Pertinent Negatives: Patient denies Fever redness. Disposition: '[]'$ ED /'[]'$ Urgent Care (no appt availability in office) / '[x]'$ Appointment(In office/virtual)/ '[]'$  [place] [facility]/ '[]'$ Home Care/ '[]'$ Refused Recommended Disposition /'[]'$ [facility]/ '[]'$  Follow-up with PCP Additional Notes: Pt states she has had  rt foot and left ankle pain for about 1 month. PT has tried IBU with slight relief. PT will also try Naproxen before appt.     Reason for Disposition  [1] MODERATE pain (e.g., interferes with normal activities, limping) AND [2] present > 3 days  Answer Assessment - Initial Assessment Questions 1. ONSET: "When did the pain start?"       1 month 2. LOCATION: "Where is the pain located?"      Left ankle and right foot 3. PAIN: "How bad is the pain?"    (Scale 1-10; or mild, moderate, severe)  - MILD (1-3): doesn't interfere with normal activities.   - MODERATE (4-7): interferes with normal activities (e.g., work or school) or awakens from sleep, limping.   - SEVERE (8-10): excruciating pain, unable to do any normal activities, unable to walk.      10/10 4. WORK OR EXERCISE: "Has there been any recent work or exercise that involved this part of the body?"      no 5. CAUSE: "What do you think is causing the foot pain?"     unsure 6. OTHER SYMPTOMS: "Do you have any other symptoms?" (e.g., leg pain, rash, fever, numbness)     no 7. PREGNANCY: "Is there any chance you are pregnant?" "When was your last menstrual period?"     no  Protocols used: Foot Pain-A-AH

## 2021-11-17 ENCOUNTER — Ambulatory Visit: Payer: Commercial Managed Care - HMO | Attending: Family Medicine

## 2021-11-17 DIAGNOSIS — Z3042 Encounter for surveillance of injectable contraceptive: Secondary | ICD-10-CM | POA: Diagnosis not present

## 2021-11-17 MED ORDER — MEDROXYPROGESTERONE ACETATE 150 MG/ML IM SUSP
150.0000 mg | Freq: Once | INTRAMUSCULAR | Status: AC
  Administered 2021-11-17: 150 mg via INTRAMUSCULAR

## 2021-11-17 NOTE — Progress Notes (Signed)
Pt arrived for depo injection Pt on time for injection No urine HCG needed Pt given injection in Right Ventrogluteal Pt tolerated injection well Pt to return JAN 18- FEB 1

## 2021-11-18 ENCOUNTER — Encounter: Payer: Self-pay | Admitting: Nurse Practitioner

## 2021-11-21 ENCOUNTER — Other Ambulatory Visit: Payer: Self-pay | Admitting: Nurse Practitioner

## 2021-11-21 DIAGNOSIS — M722 Plantar fascial fibromatosis: Secondary | ICD-10-CM

## 2021-11-21 MED ORDER — GABAPENTIN 100 MG PO CAPS: 100.0000 mg | ORAL_CAPSULE | Freq: Three times a day (TID) | ORAL | 3 refills | Status: DC

## 2021-11-25 ENCOUNTER — Other Ambulatory Visit: Payer: Self-pay | Admitting: Nurse Practitioner

## 2021-11-25 DIAGNOSIS — Z1231 Encounter for screening mammogram for malignant neoplasm of breast: Secondary | ICD-10-CM

## 2021-11-29 ENCOUNTER — Encounter: Payer: Self-pay | Admitting: Podiatry

## 2021-11-29 ENCOUNTER — Ambulatory Visit (INDEPENDENT_AMBULATORY_CARE_PROVIDER_SITE_OTHER): Payer: Commercial Managed Care - HMO

## 2021-11-29 ENCOUNTER — Ambulatory Visit: Payer: Commercial Managed Care - HMO | Admitting: Podiatry

## 2021-11-29 DIAGNOSIS — M7732 Calcaneal spur, left foot: Secondary | ICD-10-CM | POA: Diagnosis not present

## 2021-11-29 DIAGNOSIS — M722 Plantar fascial fibromatosis: Secondary | ICD-10-CM | POA: Diagnosis not present

## 2021-11-29 DIAGNOSIS — M7662 Achilles tendinitis, left leg: Secondary | ICD-10-CM | POA: Diagnosis not present

## 2021-11-29 DIAGNOSIS — M7731 Calcaneal spur, right foot: Secondary | ICD-10-CM | POA: Diagnosis not present

## 2021-11-29 MED ORDER — TRIAMCINOLONE ACETONIDE 10 MG/ML IJ SUSP: 10.0000 mg | Freq: Once | INTRAMUSCULAR | Status: DC

## 2021-11-29 NOTE — Progress Notes (Signed)
Subjective:   Patient ID: Meredith Nichols, female   DOB: 51 y.o.   MRN: [id number]   HPI Chief Complaint  Patient presents with   Plantar Fasciitis    Right foot top of the foot pain and arch and heel, left foot heel pain, started 3 months ago, rate of pain 10 out of 10, TX: pain meds     51 year old female presents above concerns.  No injuries she reports. She states it hurts daily.  She takes naproxen once it wears off she feels it is aching.  She has no swelling numbness or tingling.  No recent treatment.    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    Review of Systems  All other systems reviewed and are negative.       Objective:  Physical Exam  General: AAO x3, NAD  Dermatological: Skin is warm, dry and supple bilateral. There are no open sores, no preulcerative lesions, no rash or signs of infection present.  Vascular: Dorsalis Pedis artery and Posterior Tibial artery pedal pulses are 2/4 bilateral with immedate capillary fill time.  There is no pain with calf compression, swelling, warmth, erythema.   Neruologic: Grossly intact via light touch bilateral. Negative tinel sign but upon precussion it hurts to this area.   Musculoskeletal: Tenderness palpation on the plantar aspect the calcaneus on the insertion of plantar fascia on the right side.  Tenderness palpation of the distal portion of the left Achilles tendon.  Thompson test is negative.  There is no pain with lateral compression of the calcaneus bilaterally.  No edema no erythema.    Gait: Unassisted, Nonantalgic.       Assessment:   Right side Planter fasciitis, left side Achilles tendinitis     Plan:  -Treatment options discussed including all alternatives, risks, and complications -Etiology of symptoms were discussed -X-rays were obtained and reviewed with the patient.  3 views bilateral feet were obtained.  Decreased calcaneal inclination angle bilaterally.  Calcaneal spur is present.  No  evidence of acute fracture. -Steroid injection performed the right plantar fascia.  See procedure note below. -Prophylaxis dispensed times to help support the soft tissue. -Stretching, icing daily. -Discussed shoe modifications and good arch support.  Procedure: Injection Tendon/Ligament Discussed alternatives, risks, complications and verbal consent was obtained.  Location: Right plantar fascia at the glabrous junction; medial approach. Skin Prep: Alcohol. Injectate: 0.5cc 0.5% marcaine plain, 0.5 cc 2% lidocaine plain and, 1 cc kenalog 10. Disposition: Patient tolerated procedure well. Injection site dressed with a band-aid.  Post-injection care was discussed and return precautions discussed.   Return in about 6 weeks (around 01/10/2022).  Trula Slade DPM

## 2021-11-29 NOTE — Patient Instructions (Signed)

## 2021-12-12 ENCOUNTER — Other Ambulatory Visit: Payer: Self-pay | Admitting: Podiatry

## 2021-12-12 DIAGNOSIS — M722 Plantar fascial fibromatosis: Secondary | ICD-10-CM

## 2021-12-12 DIAGNOSIS — M7732 Calcaneal spur, left foot: Secondary | ICD-10-CM

## 2021-12-12 DIAGNOSIS — M7731 Calcaneal spur, right foot: Secondary | ICD-10-CM

## 2021-12-12 DIAGNOSIS — M7662 Achilles tendinitis, left leg: Secondary | ICD-10-CM

## 2022-01-17 ENCOUNTER — Encounter: Payer: Self-pay | Admitting: Podiatry

## 2022-01-17 ENCOUNTER — Ambulatory Visit: Payer: 59 | Admitting: Podiatry

## 2022-01-17 VITALS — BP 147/80 | HR 73

## 2022-01-17 DIAGNOSIS — M722 Plantar fascial fibromatosis: Secondary | ICD-10-CM | POA: Diagnosis not present

## 2022-01-17 NOTE — Patient Instructions (Signed)

## 2022-01-17 NOTE — Progress Notes (Unsigned)
Subjective: Chief Complaint  Patient presents with   Plantar Fasciitis    Follow up plantar fasciitis bilateral (R>L)   "Its a lot in the back too, but its better than it was though"    She is having a little soreness in the arch. She is still taking naproxen daily. She did try on Brooks and her feet felt great, but she did not get them yet.   Objective: AAO x3, NAD DP/PT pulses palpable bilaterally, CRT less than 3 seconds Protective sensation intact with Simms Weinstein monofilament, vibratory sensation intact, Achilles tendon reflex intact No areas of pinpoint bony tenderness or pain with vibratory sensation. MMT 5/5, ROM WNL. No edema, erythema, increase in warmth to bilateral lower extremities.  No open lesions or pre-ulcerative lesions.  No pain with calf compression, swelling, warmth, erythema  Assessment:  Plan: -All treatment options discussed with the patient including all alternatives, risks, complications.   -Patient encouraged to call the office with any questions, concerns, change in symptoms.

## 2022-01-24 ENCOUNTER — Ambulatory Visit
Admission: RE | Admit: 2022-01-24 | Discharge: 2022-01-24 | Disposition: A | Discharge status: Home / Self Care | Payer: 59 | Source: Ambulatory Visit | Attending: Nurse Practitioner | Admitting: Nurse Practitioner

## 2022-01-24 DIAGNOSIS — Z1231 Encounter for screening mammogram for malignant neoplasm of breast: Secondary | ICD-10-CM | POA: Diagnosis not present

## 2022-01-30 ENCOUNTER — Ambulatory Visit
Admission: EM | Admit: 2022-01-30 | Discharge: 2022-01-30 | Disposition: A | Discharge status: Home / Self Care | Payer: 59

## 2022-01-30 DIAGNOSIS — J069 Acute upper respiratory infection, unspecified: Secondary | ICD-10-CM

## 2022-01-30 DIAGNOSIS — R197 Diarrhea, unspecified: Secondary | ICD-10-CM | POA: Diagnosis not present

## 2022-01-30 NOTE — Discharge Instructions (Signed)
  Try a few doses of immodium if needed- use with caution to avoid constipation.   Try activia yogurt, other probiotic to help improve bowel health

## 2022-01-30 NOTE — ED Provider Notes (Signed)
[facility]    CSN: [id number] Arrival date & time: 01/30/22  3716      History   Chief Complaint Chief Complaint  Patient presents with   URI   Emesis   Diarrhea    HPI Meredith Nichols is a 52 y.o. female.   Patient here today for evaluation of intermittent diarrhea, vomiting, cough and congestion she has had for about 2 weeks.  She reports she did have sore throat as well but this has improved as well as her ear pain.  She does note that cough has also improved.  She has not had significant vomiting but states she has continued to have intermittent diarrhea.  She has not had any blood in her stool or dark tarry stools.  She has tried taking Pepto-Bismol with mild relief.  She has not had recent fever.    The history is provided by the patient.    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    Diabetes mellitus without complication ([place])    pre DM- no meds    GERD (gastroesophageal reflux disease)    Heart murmur     Patient Active Problem List   Diagnosis Date Noted   Heart murmur 05/23/2018   Menorrhagia with regular cycle 10/18/2017   Anemia 10/18/2017   History of diabetes mellitus, type II 02/05/2017    Past Surgical History:  Procedure Laterality Date   c sections     CESAREAN SECTION     2 previous   WISDOM TOOTH EXTRACTION     1993,2001    OB History     Gravida  3   Para      Term      Preterm      AB      Living  3      SAB      IAB      Ectopic      Multiple      Live Births  3            Home Medications    Prior to Admission medications   Medication Sig Start Date End Date Taking? Authorizing Provider  chlorthalidone (HYGROTON) 25 MG tablet Take 1 tablet (25 mg total) by mouth daily. 08/17/21   Gildardo Pounds, NP  ferrous sulfate 325 (65 FE) MG tablet Take 1 tablet (325 mg total) by mouth 2 (two) times daily with a meal. TAKE 1 TABLET BY MOUTH  EVERY DAY WITH BREAKFAST 08/21/21   Gildardo Pounds, NP  gabapentin (NEURONTIN) 100 MG capsule Take 1 capsule (100 mg total) by mouth 3 (three) times daily. Patient not taking: Reported on 11/29/2021 11/21/21   Gildardo Pounds, NP  methocarbamol (ROBAXIN) 500 MG tablet Take 1 tablet (500 mg total) by mouth every 8 (eight) hours as needed for muscle spasms. Patient not taking: Reported on 08/17/2021 10/03/20   Gildardo Pounds, NP  pravastatin (PRAVACHOL) 20 MG tablet Take 1 tablet (20 mg total) by mouth daily. For cholesterol 08/21/21   Gildardo Pounds, NP    Family History Family History  Problem Relation Age of Onset   Hypertension Mother    Diabetes Mother    Kidney disease Mother    Diabetes Brother    Colon cancer Neg Hx    Colon polyps Neg Hx    Esophageal cancer Neg Hx  Rectal cancer Neg Hx    Stomach cancer Neg Hx     Social History Social History   Tobacco Use   Smoking status: Never   Smokeless tobacco: Never  Vaping Use   Vaping Use: Never used  Substance Use Topics   Alcohol use: No   Drug use: No     Allergies   Patient has no known allergies.   Review of Systems Review of Systems  Constitutional:  Negative for chills and fever.  HENT:  Negative for congestion, ear pain and sore throat.   Eyes:  Negative for discharge and redness.  Respiratory:  Positive for cough. Negative for shortness of breath and wheezing.   Gastrointestinal:  Positive for diarrhea and vomiting. Negative for abdominal pain and nausea.     Physical Exam Triage Vital Signs ED Triage Vitals  Enc Vitals Group     BP      Pulse      Resp      Temp      Temp src      SpO2      Weight      Height      Head Circumference      Peak Flow      Pain Score      Pain Loc      Pain Edu?      Excl. in [place]?    No data found.  Updated Vital Signs BP 124/79 (BP Location: Left Arm)   Pulse 78   Temp 98.1 F (36.7 C) (Oral)   Resp 17   SpO2 97%      Physical Exam Vitals and  nursing note reviewed.  Constitutional:      General: She is not in acute distress.    Appearance: Normal appearance. She is not ill-appearing.  HENT:     Head: Normocephalic and atraumatic.     Nose: Congestion present.     Mouth/Throat:     Mouth: Mucous membranes are moist.     Pharynx: No oropharyngeal exudate or posterior oropharyngeal erythema.  Eyes:     Conjunctiva/sclera: Conjunctivae normal.  Cardiovascular:     Rate and Rhythm: Normal rate and regular rhythm.  Pulmonary:     Effort: Pulmonary effort is normal. No respiratory distress.     Breath sounds: Normal breath sounds. No wheezing, rhonchi or rales.  Skin:    General: Skin is warm and dry.  Neurological:     Mental Status: She is alert.  Psychiatric:        Mood and Affect: Mood normal.        Thought Content: Thought content normal.      UC Treatments / Results  Labs (all labs ordered are listed, but only abnormal results are displayed) Labs Reviewed - No data to display  EKG   Radiology No results found.  Procedures Procedures (including critical care time)  Medications Ordered in UC Medications - No data to display  Initial Impression / Assessment and Plan / UC Course  I have reviewed the triage vital signs and the nursing notes.  Pertinent labs & imaging results that were available during my care of the patient were reviewed by me and considered in my medical decision making (see chart for details).     Suspect likely viral etiology of symptoms.  Recommended Imodium for continued intermittent diarrhea but advised to use with caution to prevent constipation.  Recommended probiotics as well is bland diet.  Encouraged follow-up if no  gradual improvement with any further concerns.   Final Clinical Impressions(s) / UC Diagnoses   Final diagnoses:  Acute upper respiratory infection  Diarrhea, unspecified type     Discharge Instructions       Try a few doses of immodium if needed- use  with caution to avoid constipation.   Try activia yogurt, other probiotic to help improve bowel health     ED Prescriptions   None    PDMP not reviewed this encounter.   Francene Finders, PA-C 01/30/22 1125

## 2022-01-30 NOTE — ED Triage Notes (Signed)
Pt presents with intermittent diarrhea, vomiting, cough , and congestion for about 2 weeks.

## 2022-02-02 ENCOUNTER — Ambulatory Visit: Payer: 59 | Attending: Nurse Practitioner

## 2022-02-02 DIAGNOSIS — Z3042 Encounter for surveillance of injectable contraceptive: Secondary | ICD-10-CM

## 2022-02-02 MED ORDER — MEDROXYPROGESTERONE ACETATE 150 MG/ML IM SUSP
150.0000 mg | Freq: Once | INTRAMUSCULAR | Status: AC
  Administered 2022-02-02: 150 mg via INTRAMUSCULAR

## 2022-02-02 NOTE — Progress Notes (Signed)
Pt arrived for depo injection.Pt on time for depo shot no Hcg needed.Pt was giving injection in right ventragluteal. Pt tolerated injection well. Pt to return Apr.3-Apr.19. Patient voiced understanding of all discussed .

## 2022-02-06 ENCOUNTER — Encounter: Payer: Self-pay | Admitting: Podiatry

## 2022-03-08 ENCOUNTER — Other Ambulatory Visit: Payer: Self-pay | Admitting: Nurse Practitioner

## 2022-03-08 DIAGNOSIS — I10 Essential (primary) hypertension: Secondary | ICD-10-CM

## 2022-03-09 ENCOUNTER — Encounter: Payer: Self-pay | Admitting: Nurse Practitioner

## 2022-03-13 NOTE — Telephone Encounter (Signed)
Pls double book her for march 15th at 1110. Thanks!!

## 2022-03-14 NOTE — Telephone Encounter (Signed)
Scheduled

## 2022-03-31 ENCOUNTER — Ambulatory Visit: Payer: 59 | Attending: Nurse Practitioner | Admitting: Nurse Practitioner

## 2022-03-31 ENCOUNTER — Encounter: Payer: Self-pay | Admitting: Nurse Practitioner

## 2022-03-31 ENCOUNTER — Other Ambulatory Visit: Payer: Self-pay

## 2022-03-31 VITALS — BP 119/77 | HR 87 | Ht 66.0 in | Wt 244.8 lb

## 2022-03-31 DIAGNOSIS — Z23 Encounter for immunization: Secondary | ICD-10-CM | POA: Insufficient documentation

## 2022-03-31 DIAGNOSIS — R0789 Other chest pain: Secondary | ICD-10-CM | POA: Diagnosis not present

## 2022-03-31 DIAGNOSIS — D649 Anemia, unspecified: Secondary | ICD-10-CM | POA: Diagnosis not present

## 2022-03-31 DIAGNOSIS — Z79899 Other long term (current) drug therapy: Secondary | ICD-10-CM | POA: Diagnosis not present

## 2022-03-31 DIAGNOSIS — R011 Cardiac murmur, unspecified: Secondary | ICD-10-CM | POA: Diagnosis not present

## 2022-03-31 DIAGNOSIS — E119 Type 2 diabetes mellitus without complications: Secondary | ICD-10-CM | POA: Insufficient documentation

## 2022-03-31 DIAGNOSIS — Z8639 Personal history of other endocrine, nutritional and metabolic disease: Secondary | ICD-10-CM | POA: Diagnosis not present

## 2022-03-31 NOTE — Progress Notes (Signed)
Heart ache from losing mother in February.

## 2022-03-31 NOTE — Progress Notes (Signed)
Assessment & Plan:  Maddux was seen today for heart problem.  Diagnoses and all orders for this visit:  Atypical chest pain Normal EKG   Type 2 diabetes mellitus without complication, without long-term current use of insulin (HCC) -     Hemoglobin A1c -     CMP14+EGFR  Anemia, unspecified type -     CBC with Differential -     Thyroid Panel With TSH  Need for immunization against influenza -     Flu Vaccine QUAD 68mo+IM (Fluarix, Fluzone & Alfiuria Quad PF)    Patient has been counseled on age-appropriate routine health concerns for screening and prevention. These are reviewed and up-to-date. Referrals have been placed accordingly. Immunizations are up-to-date or declined.    Subjective:   Chief Complaint  Patient presents with   Heart Problem    Heart ache from losing mother 2/2   HPI Meredith Nichols 52 y.o. female presents to office today with concerns of her heart aching since the death of her mother last month. She denies any arm pain, shortness of breath, cough. Cardiac history only significant for heart murmur. She also has a history of heart murmur.   Diabetes is well controlled with diet only. LDL not quite at goal. She is taking pravastatin as prescribed.  Lab Results  Component Value Date   LDLCALC 133 (H) 08/17/2021     Review of Systems  Constitutional:  Negative for fever, malaise/fatigue and weight loss.  HENT: Negative.  Negative for nosebleeds.   Eyes: Negative.  Negative for blurred vision, double vision and photophobia.  Respiratory: Negative.  Negative for cough, hemoptysis, sputum production, shortness of breath and wheezing.   Cardiovascular:  Positive for chest pain. Negative for palpitations, orthopnea and leg swelling.  Gastrointestinal: Negative.  Negative for heartburn, nausea and vomiting.  Musculoskeletal: Negative.  Negative for myalgias.  Neurological: Negative.  Negative for dizziness, focal weakness, seizures and headaches.   Psychiatric/Behavioral: Negative.  Negative for suicidal ideas.     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   Outpatient Medications Prior to Visit  Medication Sig Dispense Refill   chlorthalidone (HYGROTON) 25 MG tablet TAKE 1 TABLET (25 MG TOTAL) BY MOUTH DAILY. 90 tablet 0   ferrous sulfate 325 (65 FE) MG tablet Take by mouth.     pravastatin (PRAVACHOL) 20 MG tablet Take 1 tablet (20 mg total) by mouth daily. For cholesterol 90 tablet 3   ferrous sulfate 325 (65 FE) MG tablet Take 1 tablet (325 mg total) by mouth 2 (two) times daily with  a meal. TAKE 1 TABLET BY MOUTH EVERY DAY WITH BREAKFAST 180 tablet 3   gabapentin (NEURONTIN) 100 MG capsule Take 1 capsule (100 mg total) by mouth 3 (three) times daily. (Patient not taking: Reported on 03/31/2022) 90 capsule 3   methocarbamol (ROBAXIN) 500 MG tablet Take 1 tablet (500 mg total) by mouth every 8 (eight) hours as needed for muscle spasms. (Patient not taking: Reported on 08/17/2021) 60 tablet 1   Facility-Administered Medications Prior to Visit  Medication Dose Route Frequency Provider Last Rate Last Admin   0.9 %  sodium chloride infusion  500 mL Intravenous Once Mansouraty, Telford Nab., MD       medroxyPROGESTERone (DEPO-PROVERA) injection 150 mg  150 mg Intramuscular Once  Gildardo Pounds, NP       triamcinolone acetonide (KENALOG) 10 MG/ML injection 10 mg  10 mg Other Once Trula Slade, DPM       triamcinolone acetonide (KENALOG) 10 MG/ML injection 10 mg  10 mg Other Once Trula Slade, DPM        No Known Allergies     Objective:    BP 119/77   Pulse 87   Ht 5\' 6"  (1.676 m)   Wt 244 lb 12.8 oz (111 kg)   SpO2 98%   BMI 39.51 kg/m  Wt Readings from Last 3 Encounters:  03/31/22 244 lb 12.8 oz (111 kg)  08/17/21 253 lb 3.2 oz (114.9 kg)  03/15/21 251 lb (113.9 kg)    Physical Exam Vitals and nursing note reviewed.  Constitutional:      Appearance: She is well-developed.  HENT:     Head: Normocephalic and atraumatic.  Cardiovascular:     Rate and Rhythm: Normal rate and regular rhythm.     Heart sounds: Normal heart sounds. No murmur heard.    No friction rub. No gallop.  Pulmonary:     Effort: Pulmonary effort is normal. No tachypnea or respiratory distress.     Breath sounds: Normal breath sounds. No decreased breath sounds, wheezing, rhonchi or rales.  Chest:     Chest wall: No tenderness.  Abdominal:     General: Bowel sounds are normal.     Palpations: Abdomen is soft.  Musculoskeletal:        General: Normal range of motion.     Cervical back: Normal range of motion.  Skin:    General: Skin is warm and dry.  Neurological:     Mental Status: She is alert and oriented to person, place, and time.     Coordination: Coordination normal.  Psychiatric:        Behavior: Behavior normal. Behavior is cooperative.        Thought Content: Thought content normal.        Judgment: Judgment normal.          Patient has been counseled extensively about nutrition and exercise as well as the importance of adherence with medications and regular follow-up. The patient was given clear instructions to go to ER or return to medical center if symptoms don't improve, worsen or new problems develop. The patient verbalized understanding.    Follow-up: Return in about 3 months (around 07/05/2022).   Gildardo Pounds, FNP-BC [facility], [place]   03/31/2022, 12:08 PM

## 2022-04-01 LAB — CBC WITH DIFFERENTIAL/PLATELET
Basophils Absolute: 0 10*3/uL (ref 0.0–0.2)
Basos: 0 %
EOS (ABSOLUTE): 0.2 10*3/uL (ref 0.0–0.4)
Eos: 4 %
Hematocrit: 39.7 % (ref 34.0–46.6)
Hemoglobin: 14 g/dL (ref 11.1–15.9)
Immature Grans (Abs): 0 10*3/uL (ref 0.0–0.1)
Immature Granulocytes: 1 %
Lymphocytes Absolute: 2.3 10*3/uL (ref 0.7–3.1)
Lymphs: 34 %
MCH: 31.6 pg (ref 26.6–33.0)
MCHC: 35.3 g/dL (ref 31.5–35.7)
MCV: 90 fL (ref 79–97)
Monocytes Absolute: 0.6 10*3/uL (ref 0.1–0.9)
Monocytes: 9 %
Neutrophils Absolute: 3.5 10*3/uL (ref 1.4–7.0)
Neutrophils: 52 %
Platelets: 278 10*3/uL (ref 150–450)
RBC: 4.43 x10E6/uL (ref 3.77–5.28)
RDW: 12 % (ref 11.7–15.4)
WBC: 6.7 10*3/uL (ref 3.4–10.8)

## 2022-04-01 LAB — THYROID PANEL WITH TSH
Free Thyroxine Index: 1.8 (ref 1.2–4.9)
T3 Uptake Ratio: 27 % (ref 24–39)
T4, Total: 6.8 ug/dL (ref 4.5–12.0)
TSH: 1.25 u[IU]/mL (ref 0.450–4.500)

## 2022-04-01 LAB — CMP14+EGFR
ALT: 19 IU/L (ref 0–32)
AST: 17 IU/L (ref 0–40)
Albumin/Globulin Ratio: 1.5 (ref 1.2–2.2)
Albumin: 4.2 g/dL (ref 3.8–4.9)
Alkaline Phosphatase: 54 IU/L (ref 44–121)
BUN/Creatinine Ratio: 13 (ref 9–23)
BUN: 10 mg/dL (ref 6–24)
Bilirubin Total: 0.3 mg/dL (ref 0.0–1.2)
CO2: 23 mmol/L (ref 20–29)
Calcium: 9.7 mg/dL (ref 8.7–10.2)
Chloride: 101 mmol/L (ref 96–106)
Creatinine, Ser: 0.76 mg/dL (ref 0.57–1.00)
Globulin, Total: 2.8 g/dL (ref 1.5–4.5)
Glucose: 232 mg/dL — ABNORMAL HIGH (ref 70–99)
Potassium: 3.4 mmol/L — ABNORMAL LOW (ref 3.5–5.2)
Sodium: 141 mmol/L (ref 134–144)
Total Protein: 7 g/dL (ref 6.0–8.5)
eGFR: 94 mL/min/{1.73_m2} (ref >59)

## 2022-04-01 LAB — HEMOGLOBIN A1C
Est. average glucose Bld gHb Est-mCnc: 171 mg/dL
Hgb A1c MFr Bld: 7.6 % — ABNORMAL HIGH (ref 4.8–5.6)

## 2022-05-03 ENCOUNTER — Ambulatory Visit: Payer: 59 | Attending: Nurse Practitioner

## 2022-05-03 DIAGNOSIS — Z3042 Encounter for surveillance of injectable contraceptive: Secondary | ICD-10-CM | POA: Diagnosis not present

## 2022-05-03 MED ORDER — MEDROXYPROGESTERONE ACETATE 150 MG/ML IM SUSP
150.0000 mg | Freq: Once | INTRAMUSCULAR | Status: AC
  Administered 2022-05-03: 150 mg via INTRAMUSCULAR

## 2022-05-03 NOTE — Progress Notes (Signed)
Pt arrived for depo injection.Pt on time for depo shot no Hcg needed.Pt was giving injection in right ventrogluteal. Pt tolerated injection well. Pt to return  July 3-July 17 Patient voiced understanding of all discussed .

## 2022-05-29 ENCOUNTER — Ambulatory Visit
Admission: RE | Admit: 2022-05-29 | Discharge: 2022-05-29 | Disposition: A | Discharge status: Home / Self Care | Payer: 59 | Source: Ambulatory Visit | Attending: Family Medicine | Admitting: Family Medicine

## 2022-05-29 VITALS — BP 127/86 | HR 102 | Temp 100.4°F | Resp 18

## 2022-05-29 DIAGNOSIS — J069 Acute upper respiratory infection, unspecified: Secondary | ICD-10-CM

## 2022-05-29 DIAGNOSIS — H60391 Other infective otitis externa, right ear: Secondary | ICD-10-CM | POA: Diagnosis not present

## 2022-05-29 MED ORDER — OFLOXACIN 0.3 % OT SOLN: 5.0000 [drp] | Freq: Two times a day (BID) | OTIC | 0 refills | Status: AC

## 2022-05-29 MED ORDER — KETOROLAC TROMETHAMINE 30 MG/ML IJ SOLN
30.0000 mg | Freq: Once | INTRAMUSCULAR | Status: AC
  Administered 2022-05-29: 30 mg via INTRAMUSCULAR

## 2022-05-29 MED ORDER — LEVOFLOXACIN 750 MG PO TABS: 750.0000 mg | ORAL_TABLET | Freq: Every day | ORAL | 0 refills | Status: DC

## 2022-05-29 NOTE — Discharge Instructions (Addendum)
Levaquin 750 mg--take 1 tablet daily for 7 days   -Floxin eardrops-5 drops in the affected ear 2 times daily for 7 days.  You have been given a shot of Toradol 30 mg today.  You can use Mucinex D for your congestion

## 2022-05-29 NOTE — ED Triage Notes (Signed)
Pt presents with c/o possible ear infection, states she feels like there is air going in and has notice some blood drainage. States she has headaches and a cough X 3 days.   Home interventions: ear drops

## 2022-05-29 NOTE — ED Provider Notes (Signed)
[facility]    CSN: [id number] Arrival date & time: 05/29/22  1247      History   Chief Complaint Chief Complaint  Patient presents with   Ear Injury    Entered by patient    HPI HANLEY DENSLOW is a 52 y.o. female.   HPI Here for right ear pain.  On May 10 she began having nasal drainage and sinus pressure and some cough.  She is also had some headache.  Then on May 11 she began having some right ear pain and some fever.  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    Diabetes mellitus without complication (HCC)    pre DM- no meds    GERD (gastroesophageal reflux disease)    Heart murmur     Patient Active Problem List   Diagnosis Date Noted   Heart murmur 05/23/2018   Menorrhagia with regular cycle 10/18/2017   Anemia 10/18/2017   History of diabetes mellitus, type II 02/05/2017    Past Surgical History:  Procedure Laterality Date   c sections     CESAREAN SECTION     2 previous   WISDOM TOOTH EXTRACTION     1993,2001    OB History     Gravida  3   Para      Term      Preterm      AB      Living  3      SAB      IAB      Ectopic      Multiple      Live Births  3            Home Medications    Prior to Admission medications   Medication Sig Start Date End Date Taking? Authorizing Provider  levofloxacin (LEVAQUIN) 750 MG tablet Take 1 tablet (750 mg total) by mouth daily. 05/29/22  Yes Zenia Resides, MD  ofloxacin (FLOXIN) 0.3 % OTIC solution Place 5 drops into both ears 2 (two) times daily for 7 days. 05/29/22 06/05/22 Yes Temprence Rhines, Janace Aris, MD  chlorthalidone (HYGROTON) 25 MG tablet TAKE 1 TABLET (25 MG TOTAL) BY MOUTH DAILY. 03/08/22   Hoy Register, MD  ferrous sulfate 325 (65 FE) MG tablet Take by mouth. 05/23/18   [provider]  pravastatin (PRAVACHOL) 20 MG tablet Take 1 tablet (20 mg total) by mouth daily. For cholesterol 08/21/21   Claiborne Rigg, NP    Family History Family History  Problem Relation Age of Onset   Hypertension Mother    Diabetes Mother    Kidney disease Mother    Diabetes Brother    Colon cancer Neg Hx    Colon polyps Neg Hx    Esophageal cancer Neg Hx    Rectal cancer Neg Hx    Stomach cancer Neg Hx     Social History Social History   Tobacco Use   Smoking status: Never   Smokeless tobacco: Never  Vaping Use   Vaping Use: Never used  Substance Use Topics   Alcohol use: No   Drug use: No     Allergies   Patient has no known allergies.   Review of Systems Review of Systems  Physical Exam Triage Vital Signs ED Triage Vitals  Enc Vitals Group     BP 05/29/22 1338 127/86     Pulse Rate 05/29/22 1338 (!) 102     Resp 05/29/22 1338 18     Temp 05/29/22 1338 (!) 100.4 F (38 C)     Temp Source 05/29/22 1338 Oral     SpO2 05/29/22 1338 98 %     Weight --      Height --      Head Circumference --      Peak Flow --      Pain Score 05/29/22 1337 8     Pain Loc --      Pain Edu? --      Excl. in GC? --    No data found.  Updated Vital Signs BP 127/86 (BP Location: Left Arm)   Pulse (!) 102   Temp (!) 100.4 F (38 C) (Oral)   Resp 18   SpO2 98%   Visual Acuity Right Eye Distance:   Left Eye Distance:   Bilateral Distance:    Right Eye Near:   Left Eye Near:    Bilateral Near:     Physical Exam Vitals reviewed.  Constitutional:      General: She is not in acute distress.    Appearance: She is not toxic-appearing.  HENT:     Left Ear: Tympanic membrane and ear canal normal.     Ears:     Comments: Right tympanic membrane is gray and dull, the part is visible.  The canal is swollen and tender and there is white adherent discharge in the canal.  The pinna is tender on traction and the tragus is tender on palpation.  There is a little  swelling around the pinna.      Nose: Nose normal.     Mouth/Throat:     Mouth: Mucous membranes are moist.     Pharynx: No oropharyngeal exudate or posterior oropharyngeal erythema.  Eyes:     Extraocular Movements: Extraocular movements intact.     Conjunctiva/sclera: Conjunctivae normal.     Pupils: Pupils are equal, round, and reactive to light.  Cardiovascular:     Rate and Rhythm: Normal rate and regular rhythm.     Heart sounds: No murmur heard. Pulmonary:     Effort: Pulmonary effort is normal. No respiratory distress.     Breath sounds: No stridor. No wheezing, rhonchi or rales.  Musculoskeletal:     Cervical back: Neck supple.  Lymphadenopathy:     Cervical: No cervical adenopathy.  Skin:    Capillary Refill: Capillary refill takes less than 2 seconds.     Coloration: Skin is not jaundiced or pale.  Neurological:     General: No focal deficit present.     Mental Status: She is alert and oriented to person, place, and time.  Psychiatric:        Behavior: Behavior normal.      UC Treatments / Results  Labs (all labs ordered are listed, but only abnormal results are displayed) Labs Reviewed - No data to display  EKG   Radiology No results found.  Procedures Procedures (including critical care time)  Medications Ordered in UC Medications  ketorolac (TORADOL) 30 MG/ML injection 30 mg (has no administration in time range)    Initial Impression / Assessment and Plan / UC Course  I have reviewed the triage vital signs and the nursing notes.  Pertinent labs & imaging results that were  available during my care of the patient were reviewed by me and considered in my medical decision making (see chart for details).        I am going to prescribe oral antibiotics and Floxin drops.  Concerned that there might be a component of malignant otitis externa.  She will use Mucinex D as needed for the congestion.  Toradol is given for the pain; last EGFR was 94 in  March of this year  She wanted to do a home COVID test instead of our doing a PCR here. Final Clinical Impressions(s) / UC Diagnoses   Final diagnoses:  Infective otitis externa of right ear  Viral URI     Discharge Instructions      Levaquin 750 mg--take 1 tablet daily for 7 days   -Floxin eardrops-5 drops in the affected ear 2 times daily for 7 days.  You have been given a shot of Toradol 30 mg today.  You can use Mucinex D for your congestion         ED Prescriptions     Medication Sig Dispense Auth. Provider   levofloxacin (LEVAQUIN) 750 MG tablet Take 1 tablet (750 mg total) by mouth daily. 7 tablet Antoniette Peake, Janace Aris, MD   ofloxacin (FLOXIN) 0.3 % OTIC solution Place 5 drops into both ears 2 (two) times daily for 7 days. 5 mL Zenia Resides, MD      PDMP not reviewed this encounter.   Zenia Resides, MD 05/29/22 1357

## 2022-06-01 ENCOUNTER — Other Ambulatory Visit: Payer: Self-pay | Admitting: Family Medicine

## 2022-06-01 DIAGNOSIS — I10 Essential (primary) hypertension: Secondary | ICD-10-CM

## 2022-06-02 NOTE — Telephone Encounter (Signed)
Requested Prescriptions  Pending Prescriptions Disp Refills   chlorthalidone (HYGROTON) 25 MG tablet [Pharmacy Med Name: CHLORTHALIDONE 25 MG TABLET] 30 tablet 2    Sig: TAKE 1 TABLET (25 MG TOTAL) BY MOUTH DAILY.     Cardiovascular: Diuretics - Thiazide Failed - 06/01/2022  9:33 PM      Failed - K in normal range and within 180 days    Potassium  Date Value Ref Range Status  03/31/2022 3.4 (L) 3.5 - 5.2 mmol/L Final         Passed - Cr in normal range and within 180 days    Creatinine, Ser  Date Value Ref Range Status  03/31/2022 0.76 0.57 - 1.00 mg/dL Final         Passed - Na in normal range and within 180 days    Sodium  Date Value Ref Range Status  03/31/2022 141 134 - 144 mmol/L Final         Passed - Last BP in normal range    BP Readings from Last 1 Encounters:  05/29/22 127/86         Passed - Valid encounter within last 6 months    Recent Outpatient Visits           2 months ago Atypical chest pain   [place] [facility] Claiborne Rigg, NP   9 months ago Encounter for annual physical exam   [place] [facility] [place], Shea Stakes, NP   1 year ago Well woman exam with routine gynecological exam   [place] [facility] Claiborne Rigg, NP   3 years ago Abnormal uterine bleeding (AUB)   [place] [facility] [place], [place] W, NP   4 years ago Iron deficiency anemia due to chronic blood loss   [facility] Claiborne Rigg, NP       Future Appointments             In 1 month Claiborne Rigg, NP [facility]

## 2022-07-05 ENCOUNTER — Ambulatory Visit: Payer: 59 | Attending: Nurse Practitioner | Admitting: Nurse Practitioner

## 2022-07-05 ENCOUNTER — Other Ambulatory Visit: Payer: Self-pay

## 2022-07-05 ENCOUNTER — Encounter: Payer: Self-pay | Admitting: Nurse Practitioner

## 2022-07-05 VITALS — BP 116/78 | HR 100 | Ht 66.0 in | Wt 241.2 lb

## 2022-07-05 DIAGNOSIS — I10 Essential (primary) hypertension: Secondary | ICD-10-CM | POA: Diagnosis not present

## 2022-07-05 DIAGNOSIS — Z7984 Long term (current) use of oral hypoglycemic drugs: Secondary | ICD-10-CM | POA: Diagnosis not present

## 2022-07-05 DIAGNOSIS — E785 Hyperlipidemia, unspecified: Secondary | ICD-10-CM | POA: Diagnosis not present

## 2022-07-05 DIAGNOSIS — E1165 Type 2 diabetes mellitus with hyperglycemia: Secondary | ICD-10-CM

## 2022-07-05 LAB — POCT GLYCOSYLATED HEMOGLOBIN (HGB A1C): Hemoglobin A1C: 7.7 % — AB (ref 4.0–5.6)

## 2022-07-05 MED ORDER — EMPAGLIFLOZIN 10 MG PO TABS: 10.0000 mg | ORAL_TABLET | Freq: Every day | ORAL | 1 refills | Status: DC

## 2022-07-05 MED ORDER — PRAVASTATIN SODIUM 20 MG PO TABS: 20.0000 mg | ORAL_TABLET | Freq: Every day | ORAL | 3 refills | Status: DC
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
## 2022-07-06 ENCOUNTER — Encounter: Payer: Self-pay | Admitting: Nurse Practitioner

## 2022-07-07 ENCOUNTER — Ambulatory Visit: Payer: 59 | Attending: Family Medicine

## 2022-07-07 DIAGNOSIS — E1165 Type 2 diabetes mellitus with hyperglycemia: Secondary | ICD-10-CM | POA: Diagnosis not present

## 2022-07-08 LAB — CMP14+EGFR
ALT: 17 IU/L (ref 0–32)
AST: 16 IU/L (ref 0–40)
Albumin: 4.2 g/dL (ref 3.8–4.9)
Alkaline Phosphatase: 51 IU/L (ref 44–121)
BUN/Creatinine Ratio: 14 (ref 9–23)
BUN: 9 mg/dL (ref 6–24)
Bilirubin Total: 0.4 mg/dL (ref 0.0–1.2)
CO2: 26 mmol/L (ref 20–29)
Calcium: 9.8 mg/dL (ref 8.7–10.2)
Chloride: 100 mmol/L (ref 96–106)
Creatinine, Ser: 0.66 mg/dL (ref 0.57–1.00)
Globulin, Total: 2.9 g/dL (ref 1.5–4.5)
Glucose: 158 mg/dL — ABNORMAL HIGH (ref 70–99)
Potassium: 4 mmol/L (ref 3.5–5.2)
Sodium: 140 mmol/L (ref 134–144)
Total Protein: 7.1 g/dL (ref 6.0–8.5)
eGFR: 105 mL/min/{1.73_m2} (ref >59)

## 2022-07-08 LAB — MICROALBUMIN / CREATININE URINE RATIO
Creatinine, Urine: 302.7 mg/dL
Microalb/Creat Ratio: 15 mg/g creat (ref 0–29)
Microalbumin, Urine: 44.9 ug/mL

## 2022-07-19 ENCOUNTER — Ambulatory Visit: Payer: 59 | Attending: Nurse Practitioner

## 2022-07-19 DIAGNOSIS — Z3042 Encounter for surveillance of injectable contraceptive: Secondary | ICD-10-CM | POA: Diagnosis not present

## 2022-07-19 MED ORDER — MEDROXYPROGESTERONE ACETATE 150 MG/ML IM SUSP
150.0000 mg | Freq: Once | INTRAMUSCULAR | Status: AC
  Administered 2022-07-19: 150 mg via INTRAMUSCULAR

## 2022-07-19 NOTE — Progress Notes (Signed)
Patient identified by name and date of birth.  Patient was given Depo-Provera injection in her right upper outer quadrant. Patient tolerated well and she will make an appointment between Sept 18 - Oct 2nd for her next injection.

## 2022-08-01 ENCOUNTER — Emergency Department (HOSPITAL_BASED_OUTPATIENT_CLINIC_OR_DEPARTMENT_OTHER): Payer: 59 | Admitting: Radiology

## 2022-08-01 ENCOUNTER — Emergency Department (HOSPITAL_BASED_OUTPATIENT_CLINIC_OR_DEPARTMENT_OTHER)
Admission: EM | Admit: 2022-08-01 | Discharge: 2022-08-01 | Disposition: A | Discharge status: Home / Self Care | Payer: 59 | Attending: Emergency Medicine | Admitting: Emergency Medicine

## 2022-08-01 ENCOUNTER — Other Ambulatory Visit (HOSPITAL_BASED_OUTPATIENT_CLINIC_OR_DEPARTMENT_OTHER): Payer: Self-pay

## 2022-08-01 ENCOUNTER — Encounter (HOSPITAL_BASED_OUTPATIENT_CLINIC_OR_DEPARTMENT_OTHER): Payer: Self-pay | Admitting: Emergency Medicine

## 2022-08-01 ENCOUNTER — Other Ambulatory Visit: Payer: Self-pay

## 2022-08-01 DIAGNOSIS — M25552 Pain in left hip: Secondary | ICD-10-CM | POA: Diagnosis not present

## 2022-08-01 DIAGNOSIS — G8911 Acute pain due to trauma: Secondary | ICD-10-CM | POA: Insufficient documentation

## 2022-08-01 DIAGNOSIS — M5442 Lumbago with sciatica, left side: Secondary | ICD-10-CM | POA: Insufficient documentation

## 2022-08-01 DIAGNOSIS — M545 Low back pain, unspecified: Secondary | ICD-10-CM | POA: Diagnosis not present

## 2022-08-01 DIAGNOSIS — M549 Dorsalgia, unspecified: Secondary | ICD-10-CM | POA: Diagnosis not present

## 2022-08-01 DIAGNOSIS — Y92410 Unspecified street and highway as the place of occurrence of the external cause: Secondary | ICD-10-CM | POA: Diagnosis not present

## 2022-08-01 DIAGNOSIS — M5136 Other intervertebral disc degeneration, lumbar region: Secondary | ICD-10-CM | POA: Diagnosis not present

## 2022-08-01 DIAGNOSIS — M47816 Spondylosis without myelopathy or radiculopathy, lumbar region: Secondary | ICD-10-CM | POA: Diagnosis not present

## 2022-08-01 MED ORDER — ACETAMINOPHEN 500 MG PO TABS
1000.0000 mg | ORAL_TABLET | Freq: Once | ORAL | Status: AC
  Administered 2022-08-01: 1000 mg via ORAL
  Filled 2022-08-01: qty 2

## 2022-08-01 MED ORDER — DICLOFENAC SODIUM 1 % EX GEL
4.0000 g | Freq: Four times a day (QID) | CUTANEOUS | 0 refills | Status: DC
  Filled 2022-08-01: qty 100, 14d supply, fill #0

## 2022-08-01 MED ORDER — KETOROLAC TROMETHAMINE 15 MG/ML IJ SOLN
15.0000 mg | Freq: Once | INTRAMUSCULAR | Status: AC
  Administered 2022-08-01: 15 mg via INTRAMUSCULAR
  Filled 2022-08-01: qty 1

## 2022-08-01 NOTE — ED Notes (Signed)
Dc instructions reviewed with patient. Patient voiced understanding. Dc with belongings.  °
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
## 2022-09-04 ENCOUNTER — Other Ambulatory Visit: Payer: Self-pay | Admitting: Nurse Practitioner

## 2022-09-04 DIAGNOSIS — I10 Essential (primary) hypertension: Secondary | ICD-10-CM

## 2022-09-04 DIAGNOSIS — E1165 Type 2 diabetes mellitus with hyperglycemia: Secondary | ICD-10-CM

## 2022-09-05 NOTE — Telephone Encounter (Signed)
Requested Prescriptions  Pending Prescriptions Disp Refills   chlorthalidone (HYGROTON) 25 MG tablet [Pharmacy Med Name: CHLORTHALIDONE 25 MG TABLET] 30 tablet 2    Sig: TAKE 1 TABLET (25 MG TOTAL) BY MOUTH DAILY.     Cardiovascular: Diuretics - Thiazide Passed - 09/04/2022 12:13 AM      Passed - Cr in normal range and within 180 days    Creatinine, Ser  Date Value Ref Range Status  07/07/2022 0.66 0.57 - 1.00 mg/dL Final         Passed - K in normal range and within 180 days    Potassium  Date Value Ref Range Status  07/07/2022 4.0 3.5 - 5.2 mmol/L Final         Passed - Na in normal range and within 180 days    Sodium  Date Value Ref Range Status  07/07/2022 140 134 - 144 mmol/L Final         Passed - Last BP in normal range    BP Readings from Last 1 Encounters:  08/01/22 125/80         Passed - Valid encounter within last 6 months    Recent Outpatient Visits           2 months ago Type 2 diabetes mellitus with hyperglycemia, without long-term current use of insulin [facility])   [place] [facility] [place], Shea Stakes, NP   5 months ago Atypical chest pain   [place] [facility] [place], Shea Stakes, NP   1 year ago Encounter for annual physical exam   [place] [facility] [place], Shea Stakes, NP   2 years ago Well woman exam with routine gynecological exam   [place] [facility] Claiborne Rigg, NP   3 years ago Abnormal uterine bleeding (AUB)   [place] [facility] [facility] Claiborne Rigg, NP       Future Appointments             In 1 month Claiborne Rigg, NP [facility]             JARDIANCE 10 MG TABS tablet [Pharmacy Med Name: JARDIANCE 10 MG TABLET] 30 tablet 1    Sig: TAKE 1 TABLET BY MOUTH DAILY BEFORE BREAKFAST.     Endocrinology:  Diabetes - SGLT2 Inhibitors Passed - 09/04/2022 12:13 AM      Passed -  Cr in normal range and within 360 days    Creatinine, Ser  Date Value Ref Range Status  07/07/2022 0.66 0.57 - 1.00 mg/dL Final         Passed - HBA1C is between 0 and 7.9 and within 180 days    Hemoglobin A1C  Date Value Ref Range Status  07/05/2022 7.7 (A) 4.0 - 5.6 % Final   Hgb A1c MFr Bld  Date Value Ref Range Status  03/31/2022 7.6 (H) 4.8 - 5.6 % Final    Comment:             Prediabetes: 5.7 - 6.4          Diabetes: >6.4          Glycemic control for adults with diabetes: <7.0          Passed - eGFR in normal range and within 360 days    GFR calc Af Amer  Date Value Ref Range Status  03/12/2019  124 >59 mL/min/1.73 Final   GFR calc non Af Amer  Date Value Ref Range Status  03/12/2019 108 >59 mL/min/1.73 Final   eGFR  Date Value Ref Range Status  07/07/2022 105 >59 mL/min/1.73 Final         Passed - Valid encounter within last 6 months    Recent Outpatient Visits           2 months ago Type 2 diabetes mellitus with hyperglycemia, without long-term current use of insulin [facility])   [place] [facility] [place], Shea Stakes, NP   5 months ago Atypical chest pain   [place] [facility] [place], Shea Stakes, NP   1 year ago Encounter for annual physical exam   [place] [facility] Claiborne Rigg, NP   2 years ago Well woman exam with routine gynecological exam   [place] [facility] Claiborne Rigg, NP   3 years ago Abnormal uterine bleeding (AUB)   [place] [facility] Claiborne Rigg, NP       Future Appointments             In 1 month Claiborne Rigg, NP [facility]

## 2022-10-05 ENCOUNTER — Other Ambulatory Visit: Payer: Self-pay | Admitting: Nurse Practitioner

## 2022-10-06 ENCOUNTER — Ambulatory Visit: Payer: 59 | Admitting: Nurse Practitioner

## 2022-10-11 ENCOUNTER — Ambulatory Visit: Payer: 59

## 2022-10-11 ENCOUNTER — Other Ambulatory Visit (HOSPITAL_COMMUNITY)
Admission: RE | Admit: 2022-10-11 | Discharge: 2022-10-11 | Disposition: A | Discharge status: Home / Self Care | Payer: 59 | Source: Ambulatory Visit | Attending: Nurse Practitioner | Admitting: Nurse Practitioner

## 2022-10-11 ENCOUNTER — Ambulatory Visit: Payer: 59 | Attending: Nurse Practitioner | Admitting: Nurse Practitioner

## 2022-10-11 ENCOUNTER — Encounter: Payer: Self-pay | Admitting: Nurse Practitioner

## 2022-10-11 VITALS — BP 118/78 | HR 82 | Ht 66.0 in | Wt 235.6 lb

## 2022-10-11 DIAGNOSIS — N760 Acute vaginitis: Secondary | ICD-10-CM | POA: Insufficient documentation

## 2022-10-11 DIAGNOSIS — I10 Essential (primary) hypertension: Secondary | ICD-10-CM | POA: Diagnosis not present

## 2022-10-11 DIAGNOSIS — Z3042 Encounter for surveillance of injectable contraceptive: Secondary | ICD-10-CM | POA: Diagnosis not present

## 2022-10-11 DIAGNOSIS — Z23 Encounter for immunization: Secondary | ICD-10-CM

## 2022-10-11 DIAGNOSIS — E1165 Type 2 diabetes mellitus with hyperglycemia: Secondary | ICD-10-CM

## 2022-10-11 DIAGNOSIS — E785 Hyperlipidemia, unspecified: Secondary | ICD-10-CM

## 2022-10-11 DIAGNOSIS — Z7984 Long term (current) use of oral hypoglycemic drugs: Secondary | ICD-10-CM | POA: Diagnosis not present

## 2022-10-11 LAB — POCT GLYCOSYLATED HEMOGLOBIN (HGB A1C): Hemoglobin A1C: 8.4 % — AB (ref 4.0–5.6)

## 2022-10-11 MED ORDER — ACCU-CHEK SOFTCLIX LANCETS MISC
3 refills | Status: AC
Start: 2022-10-11 — End: ?

## 2022-10-11 MED ORDER — PRAVASTATIN SODIUM 40 MG PO TABS
40.0000 mg | ORAL_TABLET | Freq: Every day | ORAL | 3 refills | Status: DC
Start: 2022-10-11 — End: 2023-07-30

## 2022-10-11 MED ORDER — BLOOD GLUCOSE TEST VI STRP: 1.0000 | ORAL_STRIP | Freq: Three times a day (TID) | 6 refills | Status: DC

## 2022-10-11 MED ORDER — ACCU-CHEK GUIDE VI STRP: ORAL_STRIP | 12 refills | Status: DC

## 2022-10-11 MED ORDER — LANCETS MISC. MISC
1.0000 | Freq: Three times a day (TID) | 6 refills | Status: AC
Start: 2022-10-11 — End: ?

## 2022-10-11 MED ORDER — LANCET DEVICE MISC
1.0000 | Freq: Three times a day (TID) | 0 refills | Status: AC
Start: 2022-10-11 — End: 2022-11-10

## 2022-10-11 MED ORDER — MEDROXYPROGESTERONE ACETATE 150 MG/ML IM SUSP
150.0000 mg | Freq: Once | INTRAMUSCULAR | Status: AC
Start: 2022-10-11 — End: 2022-10-11
  Administered 2022-10-11: 150 mg via INTRAMUSCULAR

## 2022-10-11 MED ORDER — GLIMEPIRIDE 1 MG PO TABS: 1.0000 mg | ORAL_TABLET | Freq: Every day | ORAL | 1 refills | Status: DC

## 2022-10-11 MED ORDER — BLOOD GLUCOSE MONITORING SUPPL DEVI: 1.0000 | Freq: Three times a day (TID) | 0 refills | Status: DC

## 2022-10-11 MED ORDER — ACCU-CHEK GUIDE ME W/DEVICE KIT
1.0000 | PACK | Freq: Once | 0 refills | Status: AC
Start: 2022-10-11 — End: 2022-10-11

## 2022-10-11 NOTE — Progress Notes (Signed)
Assessment & Plan:  [name] was seen today for medical management of chronic issues.  Diagnoses and all orders for this visit:  Type 2 diabetes mellitus with hyperglycemia, without long-term current use of insulin (HCC) Insurance will not pay for ozempic. Trulicity is over $300 Start glimepiride.  -     POCT glycosylated hemoglobin (Hb A1C) -     glimepiride (AMARYL) 1 MG tablet; Take 1 tablet (1 mg total) by mouth daily with breakfast. -     CMP14+EGFR -     Accu-Chek Softclix Lancets lancets; Use as instructed. Inject into the skin twice daily -     glucose blood (ACCU-CHEK GUIDE) test strip; Use as instructed. Check blood glucose by fingerstick twice per day. -     Blood Glucose Monitoring Suppl (ACCU-CHEK GUIDE ME) w/Device KIT; 1 each by Does not apply route once for 1 dose. -     Blood Glucose Monitoring Suppl DEVI; 1 each by Does not apply route in the morning, at noon, and at bedtime. May substitute to any manufacturer covered by patient's insurance. -     Glucose Blood (BLOOD GLUCOSE TEST STRIPS) STRP; 1 each by In Vitro route in the morning, at noon, and at bedtime. May substitute to any manufacturer covered by patient's insurance. -     Lancet Device MISC; 1 each by Does not apply route in the morning, at noon, and at bedtime. May substitute to any manufacturer covered by patient's insurance. -     Lancets Misc. MISC; 1 each by Does not apply route in the morning, at noon, and at bedtime. May substitute to any manufacturer covered by patient's insurance. -     Amb ref to Medical Nutrition Therapy-MNT  Encounter for surveillance of injectable contraceptive -     medroxyPROGESTERone (DEPO-PROVERA) injection 150 mg  Encounter for immunization -     Flu vaccine trivalent PF, 6mos and older(Flulaval,Afluria,Fluarix,Fluzone)  Dyslipidemia, goal LDL below 70 -     Lipid panel INSTRUCTIONS: Work on a low fat, heart healthy diet and participate in regular aerobic exercise program by  working out at least 150 minutes per week; 5 days a week-30 minutes per day. Avoid red meat/beef/steak,  fried foods. junk foods, sodas, sugary drinks, unhealthy snacking, alcohol and smoking.  Drink at least 80 oz of water per day and monitor your carbohydrate intake daily.    Acute vaginitis -     Cervicovaginal ancillary only  Primary hypertension Well controlled.  Continue all antihypertensives as prescribed.  Reminded to bring in blood pressure log for follow  up appointment.  RECOMMENDATIONS: DASH/Mediterranean Diets are healthier choices for HTN.       Patient has been counseled on age-appropriate routine health concerns for screening and prevention. These are reviewed and up-to-date. Referrals have been placed accordingly. Immunizations are up-to-date or declined.    Subjective:   Chief Complaint  Patient presents with   Medical Management of Chronic Issues   HPI Meredith Nichols 52 y.o. female presents to office today for follow-up to diabetes and hypertension.  She has a past medical history of Allergy, Anemia, Arthritis, DM 2, GERD, HTN and Heart murmur.   Patient has been counseled on age-appropriate routine health concerns for screening and prevention. These are reviewed and up-to-date. Referrals have been placed accordingly. Immunizations are up-to-date or declined.    MAMMOGRAM: UTD COLON CANCER SCREENING:  UTD PAP SMEAR: UTD    HTN Blood pressure is well-controlled with chlorthalidone 25 mg daily BP  Readings from Last 3 Encounters:  10/11/22 118/78  08/01/22 125/80  07/05/22 116/78     DM 2 A1c increasing and showing poorly controlled diabetes.  She is currently taking Jardiance 10 mg daily but does endorses increased vaginal discharge.  States she does not eat a lot during the day so she is not understanding why her A1c is elevated.  I did instruct her that it is not how anytime she eats but what she may be eating.  She would like a referral to dietitian  today. Lab Results  Component Value Date   HGBA1C 8.4 (A) 10/11/2022  LDL not at goal with pravastatin 20 mg daily. Lab Results  Component Value Date   LDLCALC 133 (H) 08/17/2021     Review of Systems  Constitutional:  Negative for fever, malaise/fatigue and weight loss.  HENT: Negative.  Negative for nosebleeds.   Eyes: Negative.  Negative for blurred vision, double vision and photophobia.  Respiratory: Negative.  Negative for cough and shortness of breath.   Cardiovascular: Negative.  Negative for chest pain, palpitations and leg swelling.  Gastrointestinal: Negative.  Negative for heartburn, nausea and vomiting.  Genitourinary:        Increased vaginal discharge  Musculoskeletal: Negative.  Negative for myalgias.  Neurological: Negative.  Negative for dizziness, focal weakness, seizures and headaches.  Psychiatric/Behavioral: Negative.  Negative for suicidal ideas.     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   Outpatient Medications Prior to Visit  Medication Sig Dispense Refill   chlorthalidone (HYGROTON) 25 MG tablet TAKE 1 TABLET (25 MG TOTAL) BY MOUTH DAILY. 30 tablet 2   gabapentin (NEURONTIN) 100 MG capsule TAKE 1 CAPSULE (100 MG TOTAL) BY MOUTH THREE TIMES DAILY. 90 capsule 3   JARDIANCE 10 MG TABS tablet TAKE  1 TABLET BY MOUTH DAILY BEFORE BREAKFAST. 30 tablet 1   pravastatin (PRAVACHOL) 20 MG tablet Take 1 tablet (20 mg total) by mouth daily. For cholesterol 90 tablet 3   diclofenac Sodium (VOLTAREN) 1 % GEL Apply 4 g topically 4 (four) times daily. (Patient not taking: Reported on 10/11/2022) 100 g 0   ferrous sulfate 325 (65 FE) MG tablet Take by mouth. (Patient not taking: Reported on 10/11/2022)     No facility-administered medications prior to visit.    No Known Allergies     Objective:    BP 118/78 (BP Location: Left Arm, Patient Position: Sitting, Cuff Size: Normal)   Pulse 82   Ht 5\' 6"  (1.676 m)   Wt 235 lb 9.6 oz (106.9 kg)  SpO2 100%   Breastfeeding No   BMI 38.03 kg/m  Wt Readings from Last 3 Encounters:  10/11/22 235 lb 9.6 oz (106.9 kg)  08/01/22 241 lb (109.3 kg)  07/05/22 241 lb 3.2 oz (109.4 kg)    Physical Exam Vitals and nursing note reviewed.  Constitutional:      Appearance: She is well-developed.  HENT:     Head: Normocephalic and atraumatic.  Cardiovascular:     Rate and Rhythm: Normal rate and regular rhythm.     Heart sounds: Normal heart sounds. No murmur heard.    No friction rub. No gallop.  Pulmonary:     Effort: Pulmonary effort is normal. No tachypnea or respiratory distress.     Breath sounds: Normal breath sounds. No decreased breath sounds, wheezing, rhonchi or rales.  Chest:     Chest wall: No tenderness.  Abdominal:     General: Bowel sounds are normal.     Palpations: Abdomen is soft.  Musculoskeletal:        General: Normal range of motion.     Cervical back: Normal range of motion.  Skin:    General: Skin is warm and dry.  Neurological:     Mental Status: She is alert and oriented to person, place, and time.     Coordination: Coordination normal.  Psychiatric:        Behavior: Behavior normal. Behavior is cooperative.        Thought Content: Thought content normal.        Judgment: Judgment normal.          Patient has  been counseled extensively about nutrition and exercise as well as the importance of adherence with medications and regular follow-up. The patient was given clear instructions to go to ER or return to medical center if symptoms don't improve, worsen or new problems develop. The patient verbalized understanding.   Follow-up: Return in about 3 months (around 01/10/2023).   Claiborne Rigg, FNP-BC [facility] [place], [place] [phone]   10/11/2022, 12:06 PM

## 2022-10-12 LAB — LIPID PANEL
Chol/HDL Ratio: 5.6 ratio — ABNORMAL HIGH (ref 0.0–4.4)
Cholesterol, Total: 211 mg/dL — ABNORMAL HIGH (ref 100–199)
HDL: 38 mg/dL — ABNORMAL LOW (ref >39)
LDL Chol Calc (NIH): 150 mg/dL — ABNORMAL HIGH (ref 0–99)
Triglycerides: 126 mg/dL (ref 0–149)
VLDL Cholesterol Cal: 23 mg/dL (ref 5–40)

## 2022-10-12 LAB — CERVICOVAGINAL ANCILLARY ONLY
Bacterial Vaginitis (gardnerella): POSITIVE — AB
Candida Glabrata: POSITIVE — AB
Candida Vaginitis: NEGATIVE
Chlamydia: NEGATIVE
Comment: NEGATIVE
Comment: NEGATIVE
Comment: NEGATIVE
Comment: NEGATIVE
Comment: NEGATIVE
Comment: NORMAL
Neisseria Gonorrhea: NEGATIVE
Trichomonas: NEGATIVE

## 2022-10-12 LAB — CMP14+EGFR
ALT: 26 IU/L (ref 0–32)
AST: 26 IU/L (ref 0–40)
Albumin: 4 g/dL (ref 3.8–4.9)
Alkaline Phosphatase: 53 IU/L (ref 44–121)
BUN/Creatinine Ratio: 21 (ref 9–23)
BUN: 15 mg/dL (ref 6–24)
Bilirubin Total: 0.6 mg/dL (ref 0.0–1.2)
CO2: 27 mmol/L (ref 20–29)
Calcium: 9.8 mg/dL (ref 8.7–10.2)
Chloride: 98 mmol/L (ref 96–106)
Creatinine, Ser: 0.72 mg/dL (ref 0.57–1.00)
Globulin, Total: 3.1 g/dL (ref 1.5–4.5)
Glucose: 146 mg/dL — ABNORMAL HIGH (ref 70–99)
Potassium: 3.5 mmol/L (ref 3.5–5.2)
Sodium: 139 mmol/L (ref 134–144)
Total Protein: 7.1 g/dL (ref 6.0–8.5)
eGFR: 100 mL/min/{1.73_m2} (ref >59)

## 2022-10-18 ENCOUNTER — Encounter: Payer: Self-pay | Admitting: Nurse Practitioner

## 2022-10-21 ENCOUNTER — Other Ambulatory Visit: Payer: Self-pay | Admitting: Nurse Practitioner

## 2022-10-21 MED ORDER — METRONIDAZOLE 500 MG PO TABS: 500.0000 mg | ORAL_TABLET | Freq: Two times a day (BID) | ORAL | 0 refills | Status: AC

## 2022-10-21 MED ORDER — FLUCONAZOLE 150 MG PO TABS: 150.0000 mg | ORAL_TABLET | Freq: Once | ORAL | 0 refills | Status: AC

## 2022-11-02 ENCOUNTER — Other Ambulatory Visit: Payer: Self-pay | Admitting: Nurse Practitioner

## 2022-11-02 DIAGNOSIS — E1165 Type 2 diabetes mellitus with hyperglycemia: Secondary | ICD-10-CM

## 2022-11-02 NOTE — Telephone Encounter (Signed)
Requested Prescriptions  Pending Prescriptions Disp Refills   empagliflozin (JARDIANCE) 10 MG TABS tablet [Pharmacy Med Name: JARDIANCE 10 MG TABLET] 30 tablet 0    Sig: TAKE 1 TABLET BY MOUTH DAILY BEFORE BREAKFAST.     Endocrinology:  Diabetes - SGLT2 Inhibitors Failed - 11/02/2022 12:18 AM      Failed - HBA1C is between 0 and 7.9 and within 180 days    Hemoglobin A1C  Date Value Ref Range Status  10/11/2022 8.4 (A) 4.0 - 5.6 % Final   Hgb A1c MFr Bld  Date Value Ref Range Status  03/31/2022 7.6 (H) 4.8 - 5.6 % Final    Comment:             Prediabetes: 5.7 - 6.4          Diabetes: >6.4          Glycemic control for adults with diabetes: <7.0          Passed - Cr in normal range and within 360 days    Creatinine, Ser  Date Value Ref Range Status  10/11/2022 0.72 0.57 - 1.00 mg/dL Final         Passed - eGFR in normal range and within 360 days    GFR calc Af Amer  Date Value Ref Range Status  03/12/2019 124 >59 mL/min/1.73 Final   GFR calc non Af Amer  Date Value Ref Range Status  03/12/2019 108 >59 mL/min/1.73 Final   eGFR  Date Value Ref Range Status  10/11/2022 100 >59 mL/min/1.73 Final         Passed - Valid encounter within last 6 months    Recent Outpatient Visits           3 weeks ago Type 2 diabetes mellitus with hyperglycemia, without long-term current use of insulin [facility])   [place] [facility] [place], [place] W, NP   4 months ago Type 2 diabetes mellitus with hyperglycemia, without long-term current use of insulin [facility])   [place] [facility], Shea Stakes, NP   7 months ago Atypical chest pain   [place] [facility] [place], Shea Stakes, NP   1 year ago Encounter for annual physical exam   [facility] Claiborne Rigg, NP   2 years ago Well woman exam with routine gynecological exam   [facility] Claiborne Rigg, NP       Future Appointments             In 2 months Claiborne Rigg, NP [facility]

## 2022-11-06 ENCOUNTER — Encounter: Payer: Self-pay | Admitting: Nurse Practitioner

## 2022-11-23 ENCOUNTER — Ambulatory Visit: Payer: 59

## 2022-11-30 ENCOUNTER — Ambulatory Visit: Payer: 59

## 2022-12-02 ENCOUNTER — Other Ambulatory Visit: Payer: Self-pay | Admitting: Nurse Practitioner

## 2022-12-02 DIAGNOSIS — I10 Essential (primary) hypertension: Secondary | ICD-10-CM

## 2022-12-04 ENCOUNTER — Other Ambulatory Visit: Payer: Self-pay | Admitting: Nurse Practitioner

## 2022-12-04 DIAGNOSIS — E1165 Type 2 diabetes mellitus with hyperglycemia: Secondary | ICD-10-CM

## 2022-12-04 NOTE — Telephone Encounter (Signed)
Requested Prescriptions  Pending Prescriptions Disp Refills   chlorthalidone (HYGROTON) 25 MG tablet [Pharmacy Med Name: CHLORTHALIDONE 25 MG TABLET] 90 tablet 1    Sig: TAKE 1 TABLET (25 MG TOTAL) BY MOUTH DAILY.     Cardiovascular: Diuretics - Thiazide Passed - 12/02/2022 12:15 AM      Passed - Cr in normal range and within 180 days    Creatinine, Ser  Date Value Ref Range Status  10/11/2022 0.72 0.57 - 1.00 mg/dL Final         Passed - K in normal range and within 180 days    Potassium  Date Value Ref Range Status  10/11/2022 3.5 3.5 - 5.2 mmol/L Final         Passed - Na in normal range and within 180 days    Sodium  Date Value Ref Range Status  10/11/2022 139 134 - 144 mmol/L Final         Passed - Last BP in normal range    BP Readings from Last 1 Encounters:  10/11/22 118/78         Passed - Valid encounter within last 6 months    Recent Outpatient Visits           1 month ago Type 2 diabetes mellitus with hyperglycemia, without long-term current use of insulin (HCC)   [place] [facility] [place] [place], [place] W, NP   5 months ago Type 2 diabetes mellitus with hyperglycemia, without long-term current use of insulin (HCC)   [place] [facility] [place]. [facility] Claiborne Rigg, NP   8 months ago Atypical chest pain   [place] [facility] [place] [facility] [place] Claiborne Rigg, NP   1 year ago Encounter for annual physical exam   [place] [facility] [place] [facility] [place]. [facility] Claiborne Rigg, NP   2 years ago Well woman exam with routine gynecological exam   [place] [facility] [place] [facility] [place] Claiborne Rigg, NP       Future Appointments             In 1 month Claiborne Rigg, NP [facility] [place]. [facility]

## 2022-12-07 ENCOUNTER — Ambulatory Visit: Payer: 59

## 2022-12-28 ENCOUNTER — Ambulatory Visit: Payer: 59 | Attending: Nurse Practitioner

## 2022-12-28 DIAGNOSIS — Z3042 Encounter for surveillance of injectable contraceptive: Secondary | ICD-10-CM | POA: Diagnosis not present

## 2022-12-28 MED ORDER — MEDROXYPROGESTERONE ACETATE 150 MG/ML IM SUSP
150.0000 mg | Freq: Once | INTRAMUSCULAR | Status: AC
Start: 2022-12-28 — End: 2022-12-28
  Administered 2022-12-28: 150 mg via INTRAMUSCULAR

## 2022-12-28 NOTE — Progress Notes (Signed)
Pt was given depo injection in Right Ventrogluteal  . Pt tolerated shot well Urine HCG needed:no Pt to return feb 27-mar 13

## 2023-01-01 MED ORDER — EMPAGLIFLOZIN 10 MG PO TABS
10.0000 mg | ORAL_TABLET | Freq: Every day | ORAL | 0 refills | Status: DC
Start: 2023-01-01 — End: 2023-03-29

## 2023-01-16 ENCOUNTER — Ambulatory Visit: Payer: 59 | Attending: Nurse Practitioner | Admitting: Nurse Practitioner

## 2023-01-16 VITALS — BP 125/82 | HR 76 | Wt 230.6 lb

## 2023-01-16 DIAGNOSIS — E785 Hyperlipidemia, unspecified: Secondary | ICD-10-CM | POA: Diagnosis not present

## 2023-01-16 DIAGNOSIS — Z7984 Long term (current) use of oral hypoglycemic drugs: Secondary | ICD-10-CM | POA: Diagnosis not present

## 2023-01-16 DIAGNOSIS — E1165 Type 2 diabetes mellitus with hyperglycemia: Secondary | ICD-10-CM | POA: Diagnosis not present

## 2023-01-16 LAB — POCT GLYCOSYLATED HEMOGLOBIN (HGB A1C): HbA1c, POC (controlled diabetic range): 7.4 % — AB (ref 0.0–7.0)

## 2023-01-16 NOTE — Progress Notes (Signed)
 Assessment & Plan:  Meredith Nichols was seen today for medical management of chronic issues.  Diagnoses and all orders for this visit:  Type 2 diabetes mellitus with hyperglycemia, without long-term current use of insulin  -     POCT glycosylated hemoglobin (Hb A1C) -     CMP14+EGFR -     Lipid panel Continue blood sugar control as discussed in office today, low carbohydrate diet, and regular physical exercise as tolerated, 150 minutes per week (30 min each day, 5 days per week, or 50 min 3 days per week). Keep blood sugar logs with fasting goal of 90-130 mg/dl, post prandial (after you eat) less than 180.  For Hypoglycemia: BS <60 and Hyperglycemia BS >400; contact the clinic ASAP. Annual eye exams and foot exams are recommended.   Dyslipidemia, goal LDL below 70 -     Lipid panel INSTRUCTIONS: Work on a low fat, heart healthy diet and participate in regular aerobic exercise program by working out at least 150 minutes per week; 5 days a week-30 minutes per day. Avoid red meat/beef/steak,  fried foods. junk foods, sodas, sugary drinks, unhealthy snacking, alcohol and smoking.  Drink at least 80 oz of water per day and monitor your carbohydrate intake daily.      Patient has been counseled on age-appropriate routine health concerns for screening and prevention. These are reviewed and up-to-date. Referrals have been placed accordingly. Immunizations are up-to-date or declined.    Subjective:   Chief Complaint  Patient presents with   Medical Management of Chronic Issues    Meredith Nichols 52 y.o. female presents to office today for follow-up to diabetes and hypertension.  She has a past medical history of Allergy, Anemia, Arthritis, DM 2, GERD, HTN and Heart murmur.    Patient has been counseled on age-appropriate routine health concerns for screening and prevention. These are reviewed and up-to-date. Referrals have been placed accordingly. Immunizations are up-to-date or declined.     MAMMOGRAM: UTD COLON CANCER SCREENING:  UTD PAP SMEAR: UTD    HTN Weight is trending down gradually.  Blood pressure is well-controlled with chlorthalidone  25 mg daily.   BP Readings from Last 3 Encounters:  01/16/23 125/82  10/11/22 118/78  08/01/22 125/80     DM 2 A1c down from 8.4-7.4 currently due to dietary modifications and medication adherence.  Weight is also down 11 pounds over the past 5 months.  She is cut out regular sodas from her diet (pepsi) and now drinks  0 sugar ginger ale Lab Results  Component Value Date   HGBA1C 7.4 (A) 01/16/2023  LDL not quite at goal with pravastatin  40 mg daily. Lab Results  Component Value Date   LDLCALC 150 (H) 10/11/2022      Review of Systems  Constitutional:  Negative for fever, malaise/fatigue and weight loss.  HENT: Negative.  Negative for nosebleeds.   Eyes: Negative.  Negative for blurred vision, double vision and photophobia.  Respiratory: Negative.  Negative for cough and shortness of breath.   Cardiovascular: Negative.  Negative for chest pain, palpitations and leg swelling.  Gastrointestinal: Negative.  Negative for heartburn, nausea and vomiting.  Musculoskeletal: Negative.  Negative for myalgias.  Neurological: Negative.  Negative for dizziness, focal weakness, seizures and headaches.  Psychiatric/Behavioral: Negative.  Negative for suicidal ideas.     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   Outpatient Medications Prior to Visit  Medication Sig Dispense Refill   Accu-Chek Softclix Lancets lancets Use as instructed. Inject into the skin twice daily 100 each 3   Blood Glucose Monitoring Suppl DEVI 1 each by Does not apply route in the morning, at noon, and at bedtime. May substitute to any manufacturer covered by patient's insurance. 1 each 0   chlorthalidone  (HYGROTON ) 25 MG tablet TAKE 1 TABLET (25 MG TOTAL) BY MOUTH DAILY. 90 tablet 1   empagliflozin  (JARDIANCE ) 10 MG TABS tablet Take 1 tablet (10 mg total) by mouth daily before breakfast. 90 tablet 0   gabapentin  (NEURONTIN ) 100 MG capsule TAKE 1 CAPSULE (100 MG TOTAL) BY MOUTH THREE TIMES DAILY. 90 capsule 3   glimepiride  (AMARYL ) 1 MG tablet Take 1 tablet (1 mg total) by mouth daily with breakfast. 90 tablet 1   glucose blood (ACCU-CHEK GUIDE) test strip Use as instructed. Check blood glucose by fingerstick twice per day. 100 each 12   Glucose Blood (BLOOD GLUCOSE TEST STRIPS) STRP 1 each by In Vitro route in the morning, at noon, and at bedtime. May substitute to any manufacturer covered by patient's insurance. 100 strip 6   Lancets Misc. MISC 1 each by Does not apply route in the morning, at noon, and at bedtime. May substitute to any manufacturer covered by patient's insurance. 100 each 6   pravastatin  (PRAVACHOL ) 40 MG tablet Take 1 tablet (40 mg total) by mouth daily. For cholesterol 90 tablet 3   No facility-administered medications prior to visit.    No Known Allergies     Objective:    BP 125/82 (BP Location: Left Arm, Patient Position: Sitting, Cuff Size: Normal)   Pulse 76   Wt 230 lb 9.6 oz (104.6 kg)   SpO2 100%   BMI 37.22 kg/m  Wt Readings from Last 3 Encounters:  01/16/23 230 lb 9.6 oz (104.6 kg)  10/11/22 235 lb 9.6 oz (106.9 kg)  08/01/22 241 lb (109.3 kg)     Physical Exam Vitals and nursing note reviewed.  Constitutional:      Appearance: She is well-developed.  HENT:     Head: Normocephalic and atraumatic.  Cardiovascular:     Rate and Rhythm: Normal rate and regular rhythm.     Heart sounds: Murmur heard.     No friction rub. No gallop.  Pulmonary:     Effort: Pulmonary effort is normal. No tachypnea or respiratory distress.     Breath sounds: Normal breath sounds. No decreased breath sounds, wheezing, rhonchi or rales.  Chest:     Chest wall: No tenderness.  Abdominal:     General: Bowel sounds are normal.  Palpations: Abdomen is soft.  Musculoskeletal:        General: Normal range of motion.     Cervical back: Normal range of motion.  Skin:    General: Skin is warm and dry.  Neurological:     Mental Status: She is alert and oriented to person, place, and time.     Coordination: Coordination normal.  Psychiatric:        Behavior: Behavior normal. Behavior is cooperative.        Thought Content: Thought content normal.        Judgment: Judgment normal.          Patient has been counseled extensively about nutrition and exercise as well as the importance of adherence with medications and regular follow-up. The patient was given clear instructions to go to ER or return to medical center if symptoms don't improve, worsen or new problems develop. The patient verbalized understanding.   Follow-up: Return in 3 months (on 04/18/2023).   Haze LELON Servant, FNP-BC [facility] and [facility] [place], [place] [phone]   01/16/2023, 10:02 AM

## 2023-01-17 LAB — CMP14+EGFR
ALT: 16 [IU]/L (ref 0–32)
AST: 16 [IU]/L (ref 0–40)
Albumin: 4.4 g/dL (ref 3.8–4.9)
Alkaline Phosphatase: 51 [IU]/L (ref 44–121)
BUN/Creatinine Ratio: 16 (ref 9–23)
BUN: 12 mg/dL (ref 6–24)
Bilirubin Total: 0.6 mg/dL (ref 0.0–1.2)
CO2: 23 mmol/L (ref 20–29)
Calcium: 9.9 mg/dL (ref 8.7–10.2)
Chloride: 103 mmol/L (ref 96–106)
Creatinine, Ser: 0.76 mg/dL (ref 0.57–1.00)
Globulin, Total: 3 g/dL (ref 1.5–4.5)
Glucose: 116 mg/dL — ABNORMAL HIGH (ref 70–99)
Potassium: 3.7 mmol/L (ref 3.5–5.2)
Sodium: 143 mmol/L (ref 134–144)
Total Protein: 7.4 g/dL (ref 6.0–8.5)
eGFR: 94 mL/min/{1.73_m2} (ref >59)

## 2023-01-17 LAB — LIPID PANEL
Chol/HDL Ratio: 4.9 {ratio} — ABNORMAL HIGH (ref 0.0–4.4)
Cholesterol, Total: 212 mg/dL — ABNORMAL HIGH (ref 100–199)
HDL: 43 mg/dL (ref >39)
LDL Chol Calc (NIH): 151 mg/dL — ABNORMAL HIGH (ref 0–99)
Triglycerides: 101 mg/dL (ref 0–149)
VLDL Cholesterol Cal: 18 mg/dL (ref 5–40)

## 2023-02-05 ENCOUNTER — Other Ambulatory Visit: Payer: Self-pay

## 2023-02-05 ENCOUNTER — Telehealth: Payer: Self-pay

## 2023-02-05 NOTE — Telephone Encounter (Signed)
Pharmacy Patient Advocate Encounter  Received notification from [employer] that Prior Authorization for JARDIANCE has been APPROVED from 02/05/2023 to 01/15/2098   PA #/Case ID/Reference #: [id number]  COVERMYMEDS Key: [id number]

## 2023-02-28 ENCOUNTER — Other Ambulatory Visit: Payer: Self-pay | Admitting: Nurse Practitioner

## 2023-02-28 DIAGNOSIS — Z1231 Encounter for screening mammogram for malignant neoplasm of breast: Secondary | ICD-10-CM

## 2023-03-02 ENCOUNTER — Ambulatory Visit
Admission: RE | Admit: 2023-03-02 | Discharge: 2023-03-02 | Disposition: A | Discharge status: Home / Self Care | Payer: Managed Care, Other (non HMO) | Source: Ambulatory Visit | Attending: Nurse Practitioner | Admitting: Nurse Practitioner

## 2023-03-02 DIAGNOSIS — Z1231 Encounter for screening mammogram for malignant neoplasm of breast: Secondary | ICD-10-CM

## 2023-03-08 ENCOUNTER — Encounter: Payer: Self-pay | Admitting: Nurse Practitioner

## 2023-03-22 ENCOUNTER — Ambulatory Visit: Payer: 59 | Attending: Nurse Practitioner

## 2023-03-22 DIAGNOSIS — Z3042 Encounter for surveillance of injectable contraceptive: Secondary | ICD-10-CM

## 2023-03-22 MED ORDER — MEDROXYPROGESTERONE ACETATE 150 MG/ML IM SUSP
150.0000 mg | Freq: Once | INTRAMUSCULAR | Status: AC
Start: 2023-03-22 — End: 2023-03-22
  Administered 2023-03-22: 150 mg via INTRAMUSCULAR

## 2023-03-22 NOTE — Progress Notes (Signed)
 Pt was given depo injection in left Ventrogluteal  . Pt tolerated shot well Urine HCG needed:no Pt to return May 22-June 5

## 2023-03-29 ENCOUNTER — Other Ambulatory Visit: Payer: Self-pay | Admitting: Nurse Practitioner

## 2023-03-29 DIAGNOSIS — E1165 Type 2 diabetes mellitus with hyperglycemia: Secondary | ICD-10-CM

## 2023-04-04 ENCOUNTER — Encounter: Payer: Self-pay | Admitting: Nurse Practitioner

## 2023-04-04 ENCOUNTER — Other Ambulatory Visit: Payer: Self-pay | Admitting: Pharmacist

## 2023-04-04 DIAGNOSIS — E1165 Type 2 diabetes mellitus with hyperglycemia: Secondary | ICD-10-CM

## 2023-04-04 MED ORDER — GABAPENTIN 100 MG PO CAPS: 100.0000 mg | ORAL_CAPSULE | Freq: Three times a day (TID) | ORAL | 1 refills | Status: DC

## 2023-04-04 MED ORDER — GLIMEPIRIDE 1 MG PO TABS
1.0000 mg | ORAL_TABLET | Freq: Every day | ORAL | 1 refills | Status: DC
Start: 2023-04-04 — End: 2023-09-27

## 2023-04-18 ENCOUNTER — Encounter: Payer: Self-pay | Admitting: Nurse Practitioner

## 2023-04-18 ENCOUNTER — Ambulatory Visit: Payer: 59 | Attending: Nurse Practitioner | Admitting: Nurse Practitioner

## 2023-04-18 VITALS — BP 112/75 | HR 92 | Resp 19 | Ht 66.0 in | Wt 216.4 lb

## 2023-04-18 DIAGNOSIS — I10 Essential (primary) hypertension: Secondary | ICD-10-CM | POA: Diagnosis not present

## 2023-04-18 DIAGNOSIS — Z23 Encounter for immunization: Secondary | ICD-10-CM

## 2023-04-18 DIAGNOSIS — R82998 Other abnormal findings in urine: Secondary | ICD-10-CM

## 2023-04-18 DIAGNOSIS — E1165 Type 2 diabetes mellitus with hyperglycemia: Secondary | ICD-10-CM

## 2023-04-18 DIAGNOSIS — Z7984 Long term (current) use of oral hypoglycemic drugs: Secondary | ICD-10-CM

## 2023-04-18 DIAGNOSIS — E785 Hyperlipidemia, unspecified: Secondary | ICD-10-CM

## 2023-04-18 LAB — POCT GLYCOSYLATED HEMOGLOBIN (HGB A1C): HbA1c, POC (controlled diabetic range): 6.8 % (ref 0.0–7.0)

## 2023-04-18 NOTE — Addendum Note (Signed)
 Addended by: Arbie Cookey on: 04/18/2023 10:32 AM   Modules accepted: Orders

## 2023-04-18 NOTE — Progress Notes (Addendum)
 Assessment & Plan:  Meredith Nichols was seen today for medical management of chronic issues.  Diagnoses and all orders for this visit:  Type 2 diabetes mellitus with hyperglycemia, without long-term current use of insulin (HCC) -     POCT glycosylated hemoglobin (Hb A1C) -     CMP14+EGFR Continue blood sugar control as discussed in office today, low carbohydrate diet, and regular physical exercise as tolerated, 150 minutes per week (30 min each day, 5 days per week, or 50 min 3 days per week). Keep blood sugar logs with fasting goal of 90-130 mg/dl, post prandial (after you eat) less than 180.  For Hypoglycemia: BS <60 and Hyperglycemia BS >400; contact the clinic ASAP. Annual eye exams and foot exams are recommended.    Dark urine -     Urinalysis, Complete  Primary hypertension -     CMP14+EGFR  Dyslipidemia, goal LDL below 70 -     Lipid panel    Patient has been counseled on age-appropriate routine health concerns for screening and prevention. These are reviewed and up-to-date. Referrals have been placed accordingly. Immunizations are up-to-date or declined.    Subjective:   Chief Complaint  Patient presents with   Medical Management of Chronic Issues    Meredith Nichols 53 y.o. female presents to office today for follow-up to diabetes and hypertension.  She has a past medical history of Allergy, Anemia, Arthritis, DM 2, GERD, HTN and Heart murmur.    Patient has been counseled on age-appropriate routine health concerns for screening and prevention. These are reviewed and up-to-date. Referrals have been placed accordingly. Immunizations are up-to-date or declined.    MAMMOGRAM: UTD COLON CANCER SCREENING:  UTD PAP SMEAR: UTD   Weight is trending down gradually.  She has lost almost 15 lbs since her last visit. Blood pressure is well-controlled with chlorthalidone 25 mg daily.  BP Readings from Last 3 Encounters:  04/18/23 112/75  01/16/23 125/82  10/11/22 118/78        DM 2 A1c down from 8.4-7.4 to currently 6.8 due to dietary modifications and medication adherence.  She cut out regular sodas from her diet (pepsi) and now drinks 0 sugar ginger ale Lab Results  Component Value Date   HGBA1C 6.8 04/18/2023    Lab Results  Component Value Date   HGBA1C 7.4 (A) 01/16/2023     She is requesting to have her hormones checked. Wants to know if she is in menopause or not. She is currently on depo so menstrual cycles are absent.    Had a viral illness over the weekend. Vomiting bile. No fever or diarrhea but still with associated mid abdominal pain and dark urine.  Review of Systems  Constitutional:  Negative for fever, malaise/fatigue and weight loss.  HENT: Negative.  Negative for nosebleeds.   Eyes: Negative.  Negative for blurred vision, double vision and photophobia.  Respiratory: Negative.  Negative for cough and shortness of breath.   Cardiovascular: Negative.  Negative for chest pain, palpitations and leg swelling.  Gastrointestinal:  Positive for abdominal pain. Negative for blood in stool, constipation, diarrhea, heartburn, melena, nausea and vomiting.  Genitourinary:        Dark urine  Musculoskeletal: Negative.  Negative for myalgias.  Neurological: Negative.  Negative for dizziness, focal weakness, seizures and headaches.  Psychiatric/Behavioral: Negative.  Negative for suicidal ideas.     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   Outpatient Medications Prior to Visit  Medication Sig Dispense Refill   Accu-Chek Softclix Lancets lancets Use as instructed. Inject into the skin twice daily 100 each 3   Blood Glucose Monitoring Suppl DEVI 1 each by Does not apply route in the morning, at noon, and at bedtime. May substitute to any manufacturer covered by patient's insurance. 1 each 0   chlorthalidone (HYGROTON) 25 MG tablet TAKE 1 TABLET (25 MG TOTAL) BY MOUTH DAILY. 90 tablet 1   gabapentin (NEURONTIN) 100 MG capsule Take 1 capsule (100 mg total) by mouth 3 (three) times daily. 270 capsule 1   glimepiride (AMARYL) 1 MG tablet Take 1 tablet (1 mg total) by mouth daily with breakfast. 90 tablet 1   glucose blood (ACCU-CHEK GUIDE) test strip Use as instructed. Check blood glucose by fingerstick twice per day. 100 each 12   Glucose Blood (BLOOD GLUCOSE TEST STRIPS) STRP 1 each by In Vitro route in the morning, at noon, and at bedtime. May substitute to any manufacturer covered by patient's insurance. 100 strip 6   JARDIANCE 10 MG TABS tablet TAKE 1 TABLET BY MOUTH DAILY BEFORE BREAKFAST. 90 tablet 0   Lancets Misc. MISC 1 each by Does not apply route in the morning, at noon, and at bedtime. May substitute to any manufacturer covered by patient's insurance. 100 each 6   pravastatin (PRAVACHOL) 40 MG tablet Take 1 tablet (40 mg total) by mouth daily. For cholesterol 90 tablet 3   No facility-administered medications prior to visit.    No Known Allergies     Objective:    BP 112/75 (BP Location: Left Arm, Patient Position: Sitting, Cuff Size: Normal)   Pulse 92   Resp 19   Ht 5\' 6"  (1.676 m)   Wt 216 lb 6.4 oz (98.2 kg)   SpO2 100%   BMI 34.93 kg/m  Wt Readings from Last 3 Encounters:  04/18/23 216 lb 6.4 oz (98.2 kg)  01/16/23 230 lb 9.6 oz (104.6 kg)  10/11/22 235  lb 9.6 oz (106.9 kg)    Physical Exam Vitals and nursing note reviewed.  Constitutional:      Appearance: She is well-developed.  HENT:     Head: Normocephalic and atraumatic.  Cardiovascular:     Rate and Rhythm: Normal rate and regular rhythm.     Heart sounds: Normal heart sounds. No murmur heard.    No friction rub. No gallop.  Pulmonary:     Effort: Pulmonary effort is normal. No tachypnea or respiratory distress.     Breath sounds: Normal breath sounds. No decreased breath sounds, wheezing, rhonchi or rales.  Chest:     Chest wall: No tenderness.  Abdominal:     General: Bowel sounds are normal.     Palpations: Abdomen is soft.  Musculoskeletal:        General: Normal range of motion.     Cervical back: Normal range of motion.  Skin:    General: Skin is warm and dry.  Neurological:     Mental Status: She is alert and oriented to person, place, and time.     Coordination: Coordination normal.  Psychiatric:        Behavior: Behavior normal. Behavior is cooperative.        Thought Content: Thought content normal.        Judgment: Judgment normal.          Patient has been counseled extensively about nutrition and exercise as well as the importance of adherence with medications and regular follow-up. The patient was given clear instructions to go to ER or return to medical center if symptoms don't improve, worsen or new problems develop. The patient verbalized understanding.   Follow-up: Return in about 3 months (around 07/18/2023).   Claiborne Rigg, FNP-BC [facility] and [facility] [place], [place] [phone]   04/18/2023, 10:02 AM

## 2023-04-19 LAB — CMP14+EGFR
ALT: 51 IU/L — ABNORMAL HIGH (ref 0–32)
AST: 41 IU/L — ABNORMAL HIGH (ref 0–40)
Albumin: 4.3 g/dL (ref 3.8–4.9)
Alkaline Phosphatase: 84 IU/L (ref 44–121)
BUN/Creatinine Ratio: 20 (ref 9–23)
BUN: 14 mg/dL (ref 6–24)
Bilirubin Total: 1.2 mg/dL (ref 0.0–1.2)
CO2: 25 mmol/L (ref 20–29)
Calcium: 10 mg/dL (ref 8.7–10.2)
Chloride: 94 mmol/L — ABNORMAL LOW (ref 96–106)
Creatinine, Ser: 0.71 mg/dL (ref 0.57–1.00)
Globulin, Total: 3.3 g/dL (ref 1.5–4.5)
Glucose: 108 mg/dL — ABNORMAL HIGH (ref 70–99)
Potassium: 3.2 mmol/L — ABNORMAL LOW (ref 3.5–5.2)
Sodium: 137 mmol/L (ref 134–144)
Total Protein: 7.6 g/dL (ref 6.0–8.5)
eGFR: 101 mL/min/{1.73_m2} (ref >59)

## 2023-04-19 LAB — URINALYSIS, COMPLETE
Nitrite, UA: NEGATIVE
RBC, UA: NEGATIVE
Urobilinogen, Ur: 2 mg/dL — ABNORMAL HIGH (ref 0.2–1.0)
pH, UA: 6 (ref 5.0–7.5)

## 2023-04-19 LAB — LIPID PANEL
Chol/HDL Ratio: 4.9 ratio — ABNORMAL HIGH (ref 0.0–4.4)
Cholesterol, Total: 185 mg/dL (ref 100–199)
HDL: 38 mg/dL — ABNORMAL LOW
LDL Chol Calc (NIH): 127 mg/dL — ABNORMAL HIGH (ref 0–99)
Triglycerides: 108 mg/dL (ref 0–149)
VLDL Cholesterol Cal: 20 mg/dL (ref 5–40)

## 2023-04-19 LAB — MICROSCOPIC EXAMINATION
Bacteria, UA: NONE SEEN
Casts: NONE SEEN /LPF

## 2023-04-23 ENCOUNTER — Encounter: Payer: Self-pay | Admitting: Nurse Practitioner

## 2023-05-03 ENCOUNTER — Other Ambulatory Visit: Payer: Self-pay

## 2023-05-03 DIAGNOSIS — E1165 Type 2 diabetes mellitus with hyperglycemia: Secondary | ICD-10-CM

## 2023-05-03 MED ORDER — BLOOD GLUCOSE MONITORING SUPPL DEVI
1.0000 | Freq: Three times a day (TID) | 0 refills | Status: DC
Start: 2023-05-03 — End: 2023-07-18

## 2023-05-03 NOTE — Telephone Encounter (Signed)
Will forward to nurse 

## 2023-05-09 ENCOUNTER — Other Ambulatory Visit: Payer: Self-pay | Admitting: Nurse Practitioner

## 2023-05-09 ENCOUNTER — Encounter: Payer: Self-pay | Admitting: Nurse Practitioner

## 2023-05-09 DIAGNOSIS — H1013 Acute atopic conjunctivitis, bilateral: Secondary | ICD-10-CM

## 2023-05-09 MED ORDER — OLOPATADINE HCL 0.2 % OP SOLN: 1.0000 [drp] | Freq: Every day | OPHTHALMIC | 6 refills | Status: DC

## 2023-05-22 ENCOUNTER — Other Ambulatory Visit: Payer: Self-pay | Admitting: Nurse Practitioner

## 2023-05-22 DIAGNOSIS — I10 Essential (primary) hypertension: Secondary | ICD-10-CM

## 2023-05-24 ENCOUNTER — Encounter: Payer: Self-pay | Admitting: Nurse Practitioner

## 2023-06-07 ENCOUNTER — Ambulatory Visit: Attending: Nurse Practitioner

## 2023-06-07 DIAGNOSIS — Z3042 Encounter for surveillance of injectable contraceptive: Secondary | ICD-10-CM | POA: Diagnosis not present

## 2023-06-07 MED ORDER — MEDROXYPROGESTERONE ACETATE 150 MG/ML IM SUSP
150.0000 mg | Freq: Once | INTRAMUSCULAR | Status: AC
  Administered 2023-06-07: 150 mg via INTRAMUSCULAR

## 2023-06-07 NOTE — Progress Notes (Signed)
 Pt was given depo injection in left Ventrogluteal  . Pt tolerated shot well Urine HCG needed:no Pt to return AUG 7-AUG21

## 2023-06-23 ENCOUNTER — Other Ambulatory Visit: Payer: Self-pay | Admitting: Nurse Practitioner

## 2023-06-23 DIAGNOSIS — E1165 Type 2 diabetes mellitus with hyperglycemia: Secondary | ICD-10-CM

## 2023-07-18 ENCOUNTER — Encounter: Payer: Self-pay | Admitting: Nurse Practitioner

## 2023-07-18 ENCOUNTER — Ambulatory Visit: Attending: Nurse Practitioner | Admitting: Nurse Practitioner

## 2023-07-18 ENCOUNTER — Other Ambulatory Visit: Payer: Self-pay

## 2023-07-18 VITALS — BP 116/76 | HR 73 | Ht 66.0 in | Wt 217.8 lb

## 2023-07-18 DIAGNOSIS — Z7984 Long term (current) use of oral hypoglycemic drugs: Secondary | ICD-10-CM | POA: Diagnosis not present

## 2023-07-18 DIAGNOSIS — E119 Type 2 diabetes mellitus without complications: Secondary | ICD-10-CM

## 2023-07-18 DIAGNOSIS — I10 Essential (primary) hypertension: Secondary | ICD-10-CM

## 2023-07-18 DIAGNOSIS — E66812 Obesity, class 2: Secondary | ICD-10-CM | POA: Diagnosis not present

## 2023-07-18 MED ORDER — TIRZEPATIDE 2.5 MG/0.5ML ~~LOC~~ SOAJ
2.5000 mg | SUBCUTANEOUS | 1 refills | Status: DC
Start: 2023-07-18 — End: 2023-08-21
  Filled 2023-07-18: qty 2, 28d supply, fill #0
  Filled 2023-08-15: qty 2, 28d supply, fill #1

## 2023-07-18 MED ORDER — EMPAGLIFLOZIN 10 MG PO TABS: 10.0000 mg | ORAL_TABLET | Freq: Every day | ORAL | 1 refills | Status: AC

## 2023-07-18 NOTE — Progress Notes (Signed)
 Assessment & Plan:  Meredith Nichols was seen today for medical management of chronic issues.  Diagnoses and all orders for this visit:  Diabetes mellitus treated with oral medication (HCC) -     Basic metabolic panel with GFR -     Hemoglobin A1c  Primary hypertension    Patient has been counseled on age-appropriate routine health concerns for screening and prevention. These are reviewed and up-to-date. Referrals have been placed accordingly. Immunizations are up-to-date or declined.    Subjective:   Chief Complaint  Patient presents with   Medical Management of Chronic Issues    Meredith Nichols 53 y.o. female presents to office today for HTN and Obesity  She has a past medical history of Allergy, Anemia, Arthritis, DM 2, GERD, HTN and Heart murmur.    Patient has been counseled on age-appropriate routine health concerns for screening and prevention. These are reviewed and up-to-date. Referrals have been placed accordingly. Immunizations are up-to-date or declined.    MAMMOGRAM: UTD COLON CANCER SCREENING:  UTD PAP SMEAR: UTD    HTN Blood pressure is well controlled. She is currently chlorthalidone  25 mg daily. BP Readings from Last 3 Encounters:  07/18/23 116/76  04/18/23 112/75  01/16/23 125/82     Meredith Nichols is obese and we are requesting Mounjaro for weight management today.  The patient is new to therapy and is 53 years old with a BMI of 35.15  today in office. Sh For now e has a co morbid history of : Primary hypertension for which we are managing with an antihypertensive and Diabetes type 2 which we are managing with SLGT2. The patient is currently on and will continue lifestyle modification including structured nutrition and physical activity at the time GLP1 therapy commences and she  will NOT be using the requested agent in combination with another GLP-1  She does NOT have any FDA-labeled contraindications to the requested agent, including history of medullary thyroid   cancer or  multiple endocrine neoplasia type II Current weight loss activities Meredith Nichols has implemented include: Increased activity,  calorie counting, reduced caloric diet and step counting with goal 10,000 steps per day. Her weight is gradually trending down.  Review of Systems  Constitutional:  Negative for fever, malaise/fatigue and weight loss.  HENT: Negative.  Negative for nosebleeds.   Eyes: Negative.  Negative for blurred vision, double vision and photophobia.  Respiratory: Negative.  Negative for cough and shortness of breath.   Cardiovascular: Negative.  Negative for chest pain, palpitations and leg swelling.  Gastrointestinal: Negative.  Negative for heartburn, nausea and vomiting.  Musculoskeletal: Negative.  Negative for myalgias.  Neurological: Negative.  Negative for dizziness, focal weakness, seizures and headaches.  Psychiatric/Behavioral: Negative.  Negative for suicidal ideas.     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   Outpatient Medications Prior to Visit  Medication Sig Dispense Refill   Accu-Chek Softclix Lancets lancets Use as instructed. Inject into the skin twice daily 100 each 3   Blood Glucose Monitoring Suppl DEVI 1 each by Does not apply route in the morning, at noon, and at bedtime. May substitute  to any manufacturer covered by patient's insurance. 1 each 0   chlorthalidone  (HYGROTON ) 25 MG tablet TAKE 1 TABLET (25 MG TOTAL) BY MOUTH DAILY. 30 tablet 5   gabapentin  (NEURONTIN ) 100 MG capsule Take 1 capsule (100 mg total) by mouth 3 (three) times daily. 270 capsule 1   glimepiride  (AMARYL ) 1 MG tablet Take 1 tablet (1 mg total) by mouth daily with breakfast. 90 tablet 1   JARDIANCE  10 MG TABS tablet TAKE 1 TABLET BY MOUTH DAILY BEFORE BREAKFAST. 90 tablet 0   Lancets Misc. MISC 1 each by Does not apply route in the morning, at noon, and at bedtime. May substitute to any manufacturer covered by patient's insurance. 100 each 6   pravastatin  (PRAVACHOL ) 40 MG tablet Take 1 tablet (40 mg total) by mouth daily. For cholesterol 90 tablet 3   glucose blood (ACCU-CHEK GUIDE) test strip Use as instructed. Check blood glucose by fingerstick twice per day. 100 each 12   Glucose Blood (BLOOD GLUCOSE TEST STRIPS) STRP 1 each by In Vitro route in the morning, at noon, and at bedtime. May substitute to any manufacturer covered by patient's insurance. 100 strip 6   Olopatadine  HCl 0.2 % SOLN Apply 1 drop to eye daily. 2.5 mL 6   No facility-administered medications prior to visit.    No Known Allergies     Objective:    BP 116/76   Pulse 73   Ht 5' 6 (1.676 m)   Wt 217 lb 12.8 oz (98.8 kg)   SpO2 99%   BMI 35.15 kg/m  Wt Readings from Last 3 Encounters:  07/18/23 217 lb 12.8 oz (98.8 kg)  04/18/23 216 lb 6.4 oz (98.2 kg)  01/16/23 230 lb 9.6 oz (104.6 kg)    Physical Exam Vitals and nursing note reviewed.  Constitutional:      Appearance: She is well-developed.  HENT:     Head: Normocephalic and atraumatic.  Cardiovascular:     Rate and Rhythm: Normal rate and regular rhythm.     Heart sounds: Normal heart sounds. No murmur heard.    No friction rub. No gallop.  Pulmonary:     Effort: Pulmonary effort is normal. No tachypnea or respiratory distress.     Breath sounds: Normal breath  sounds. No decreased breath sounds, wheezing, rhonchi or rales.  Chest:     Chest wall: No tenderness.  Abdominal:     General: Bowel sounds are normal.     Palpations: Abdomen is soft.  Musculoskeletal:        General: Normal range of motion.     Cervical back: Normal range of motion.  Skin:    General: Skin is warm and dry.  Neurological:     Mental Status: She is alert and oriented to person, place, and time.     Coordination: Coordination normal.  Psychiatric:        Behavior: Behavior normal. Behavior is cooperative.  Thought Content: Thought content normal.        Judgment: Judgment normal.          Patient has been counseled extensively about nutrition and exercise as well as the importance of adherence with medications and regular follow-up. The patient was given clear instructions to go to ER or return to medical center if symptoms don't improve, worsen or new problems develop. The patient verbalized understanding.   Follow-up: Return in about 3 months (around 10/22/2023).   Haze LELON Servant, FNP-BC [facility] and [facility] [place], [place] [phone]   07/18/2023, 9:24 AM

## 2023-07-19 ENCOUNTER — Other Ambulatory Visit: Payer: Self-pay

## 2023-07-19 LAB — BASIC METABOLIC PANEL WITH GFR
BUN/Creatinine Ratio: 20 (ref 9–23)
BUN: 14 mg/dL (ref 6–24)
CO2: 22 mmol/L (ref 20–29)
Calcium: 10.1 mg/dL (ref 8.7–10.2)
Chloride: 102 mmol/L (ref 96–106)
Creatinine, Ser: 0.7 mg/dL (ref 0.57–1.00)
Glucose: 116 mg/dL — ABNORMAL HIGH (ref 70–99)
Potassium: 3.4 mmol/L — ABNORMAL LOW (ref 3.5–5.2)
Sodium: 144 mmol/L (ref 134–144)
eGFR: 103 mL/min/{1.73_m2} (ref >59)

## 2023-07-19 LAB — HEMOGLOBIN A1C
Est. average glucose Bld gHb Est-mCnc: 151 mg/dL
Hgb A1c MFr Bld: 6.9 % — ABNORMAL HIGH (ref 4.8–5.6)

## 2023-07-21 ENCOUNTER — Ambulatory Visit: Payer: Self-pay | Admitting: Nurse Practitioner

## 2023-07-21 DIAGNOSIS — E876 Hypokalemia: Secondary | ICD-10-CM

## 2023-07-23 ENCOUNTER — Other Ambulatory Visit: Payer: Self-pay

## 2023-07-25 ENCOUNTER — Other Ambulatory Visit: Payer: Self-pay

## 2023-07-29 ENCOUNTER — Other Ambulatory Visit: Payer: Self-pay | Admitting: Nurse Practitioner

## 2023-07-29 DIAGNOSIS — I10 Essential (primary) hypertension: Secondary | ICD-10-CM

## 2023-07-29 DIAGNOSIS — E785 Hyperlipidemia, unspecified: Secondary | ICD-10-CM

## 2023-08-15 ENCOUNTER — Other Ambulatory Visit: Payer: Self-pay

## 2023-08-17 ENCOUNTER — Other Ambulatory Visit: Payer: Self-pay

## 2023-08-21 ENCOUNTER — Ambulatory Visit: Attending: Nurse Practitioner

## 2023-08-21 ENCOUNTER — Other Ambulatory Visit: Payer: Self-pay

## 2023-08-21 ENCOUNTER — Encounter: Payer: Self-pay | Admitting: Nurse Practitioner

## 2023-08-21 ENCOUNTER — Other Ambulatory Visit: Payer: Self-pay | Admitting: Nurse Practitioner

## 2023-08-21 DIAGNOSIS — I10 Essential (primary) hypertension: Secondary | ICD-10-CM

## 2023-08-21 DIAGNOSIS — E66812 Obesity, class 2: Secondary | ICD-10-CM

## 2023-08-21 DIAGNOSIS — E876 Hypokalemia: Secondary | ICD-10-CM

## 2023-08-21 DIAGNOSIS — E119 Type 2 diabetes mellitus without complications: Secondary | ICD-10-CM

## 2023-08-21 MED ORDER — TIRZEPATIDE 2.5 MG/0.5ML ~~LOC~~ SOAJ: 2.5000 mg | SUBCUTANEOUS | 1 refills | Status: DC

## 2023-08-22 ENCOUNTER — Encounter: Payer: Self-pay | Admitting: Nurse Practitioner

## 2023-08-22 LAB — POTASSIUM: Potassium: 4.1 mmol/L (ref 3.5–5.2)

## 2023-08-23 ENCOUNTER — Ambulatory Visit

## 2023-08-23 ENCOUNTER — Other Ambulatory Visit: Payer: Self-pay | Admitting: Nurse Practitioner

## 2023-08-23 ENCOUNTER — Telehealth: Payer: Self-pay | Admitting: Pharmacist

## 2023-08-23 DIAGNOSIS — E66812 Obesity, class 2: Secondary | ICD-10-CM

## 2023-08-23 DIAGNOSIS — E119 Type 2 diabetes mellitus without complications: Secondary | ICD-10-CM

## 2023-08-23 DIAGNOSIS — I10 Essential (primary) hypertension: Secondary | ICD-10-CM

## 2023-08-23 NOTE — Telephone Encounter (Signed)
 Hey friend,   Are we able to attempt a PA for Mounjaro ?

## 2023-08-24 ENCOUNTER — Other Ambulatory Visit: Payer: Self-pay

## 2023-08-24 ENCOUNTER — Other Ambulatory Visit: Payer: Self-pay | Admitting: Pharmacist

## 2023-08-24 MED ORDER — TIRZEPATIDE 5 MG/0.5ML ~~LOC~~ SOAJ
5.0000 mg | SUBCUTANEOUS | 0 refills | Status: DC
Start: 2023-08-24 — End: 2023-09-17

## 2023-08-30 ENCOUNTER — Other Ambulatory Visit: Payer: Self-pay

## 2023-09-14 ENCOUNTER — Other Ambulatory Visit: Payer: Self-pay | Admitting: Family Medicine

## 2023-09-17 NOTE — Telephone Encounter (Signed)
 Requested medication (s) are due for refill today- yes  Requested medication (s) are on the active medication list -yes  Future visit scheduled -yes  Last refill: 08/24/23 2ml  Notes to clinic: off protocol- provide review   Requested Prescriptions  Pending Prescriptions Disp Refills   MOUNJARO  5 MG/0.5ML Pen [Pharmacy Med Name: MOUNJARO  5 MG/0.5 ML PEN]      Sig: INJECT 5 MG SUBCUTANEOUSLY WEEKLY     Off-Protocol Failed - 09/17/2023  7:56 AM      Failed - Medication not assigned to a protocol, review manually.      Passed - Valid encounter within last 12 months    Recent Outpatient Visits           2 months ago Diabetes mellitus treated with oral medication (HCC)   [facility] [place]. [facility] [place], [place] W, NP   5 months ago Type 2 diabetes mellitus with hyperglycemia, without long-term current use of insulin (HCC)   [facility] Dept Of [place]. [facility] [place], [place] W, NP   8 months ago Type 2 diabetes mellitus with hyperglycemia, without long-term current use of insulin (HCC)   [facility] Dept Of [place]. [facility] [place], [place] W, NP   11 months ago Type 2 diabetes mellitus with hyperglycemia, without long-term current use of insulin (HCC)   [facility] Dept Of [place]. [facility] [place], [place] W, NP   1 year ago Type 2 diabetes mellitus with hyperglycemia, without long-term current use of insulin (HCC)   [facility] Dept Of [place]. [facility] Theotis Haze ORN, NP       Future Appointments             In 1 month Theotis Haze ORN, NP [facility] - A Dept Of [place]. [facility], [address]               Requested Prescriptions  Pending Prescriptions Disp Refills   MOUNJARO  5 MG/0.5ML Pen [Pharmacy Med Name: MOUNJARO  5 MG/0.5 ML PEN]       Sig: INJECT 5 MG SUBCUTANEOUSLY WEEKLY     Off-Protocol Failed - 09/17/2023  7:56 AM      Failed - Medication not assigned to a protocol, review manually.      Passed - Valid encounter within last 12 months    Recent Outpatient Visits           2 months ago Diabetes mellitus treated with oral medication (HCC)   [facility] Dept Of [place]. [facility] [place], [place] W, NP   5 months ago Type 2 diabetes mellitus with hyperglycemia, without long-term current use of insulin (HCC)   [facility] [facility] Dept Of [place]. [facility] [place], [place] W, NP   8 months ago Type 2 diabetes mellitus with hyperglycemia, without long-term current use of insulin (HCC)   [facility] Dept Of [place]. [facility] [place], [place] W, NP   11 months ago Type 2 diabetes mellitus with hyperglycemia, without long-term current use of insulin (HCC)   [facility] Dept Of [place]. [facility] Theotis Haze ORN, [place]  1 year ago Type 2 diabetes mellitus with hyperglycemia, without long-term current use of insulin (HCC)   [facility] Dept Of [place]. [facility] Theotis Haze ORN, NP       Future Appointments             In 1 month Theotis Haze ORN, NP [facility] Dept Of [place]. [facility], [place]

## 2023-09-26 ENCOUNTER — Other Ambulatory Visit: Payer: Self-pay | Admitting: Family Medicine

## 2023-09-26 DIAGNOSIS — E1165 Type 2 diabetes mellitus with hyperglycemia: Secondary | ICD-10-CM

## 2023-10-16 ENCOUNTER — Other Ambulatory Visit: Payer: Self-pay | Admitting: Family Medicine

## 2023-10-17 ENCOUNTER — Telehealth: Payer: Self-pay | Admitting: Nurse Practitioner

## 2023-10-17 NOTE — Telephone Encounter (Signed)
LVM for pt with appt details.

## 2023-10-22 ENCOUNTER — Ambulatory Visit: Admitting: Nurse Practitioner

## 2023-10-22 ENCOUNTER — Other Ambulatory Visit: Payer: Self-pay | Admitting: Nurse Practitioner

## 2023-10-22 DIAGNOSIS — E1165 Type 2 diabetes mellitus with hyperglycemia: Secondary | ICD-10-CM

## 2023-11-03 ENCOUNTER — Inpatient Hospital Stay: Admission: RE | Admit: 2023-11-03 | Discharge: 2023-11-03

## 2023-11-03 ENCOUNTER — Encounter: Payer: Self-pay | Admitting: Emergency Medicine

## 2023-11-03 VITALS — BP 116/80 | HR 77 | Temp 98.3°F | Resp 18 | Wt 217.8 lb

## 2023-11-03 DIAGNOSIS — L739 Follicular disorder, unspecified: Secondary | ICD-10-CM

## 2023-11-03 DIAGNOSIS — B350 Tinea barbae and tinea capitis: Secondary | ICD-10-CM | POA: Diagnosis not present

## 2023-11-03 DIAGNOSIS — L219 Seborrheic dermatitis, unspecified: Secondary | ICD-10-CM

## 2023-11-03 MED ORDER — KETOCONAZOLE 2 % EX SHAM: 1.0000 | MEDICATED_SHAMPOO | CUTANEOUS | 0 refills | Status: AC

## 2023-11-03 MED ORDER — FLUCONAZOLE 200 MG PO TABS: 200.0000 mg | ORAL_TABLET | ORAL | 0 refills | Status: DC

## 2023-11-03 MED ORDER — DOXYCYCLINE HYCLATE 100 MG PO CAPS: 100.0000 mg | ORAL_CAPSULE | Freq: Two times a day (BID) | ORAL | 0 refills | Status: AC

## 2023-11-03 NOTE — ED Provider Notes (Signed)
 [facility]    CSN: [id number] Arrival date & time: 11/03/23  1238      History   Chief Complaint Chief Complaint  Patient presents with   Allergic Reaction    And pain near ear - Entered by patient    HPI Meredith Nichols is a 53 y.o. female.  10 days ago got a weave, new brand she had never used before.  Starting having itching of the scalp. Feeling burning sensation. She took the weave out 2 days ago. Symptoms mildly improved but still occurring. 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    Diabetes mellitus without complication (HCC)    pre DM- no meds    GERD (gastroesophageal reflux disease)    Heart murmur     Patient Active Problem List   Diagnosis Date Noted   Heart murmur 05/23/2018   Menorrhagia with regular cycle 10/18/2017   Anemia 10/18/2017   History of diabetes mellitus, type II 02/05/2017    Past Surgical History:  Procedure Laterality Date   c sections     CESAREAN SECTION     2 previous   WISDOM TOOTH EXTRACTION     1993,2001    OB History     Gravida  3   Para      Term      Preterm      AB      Living  3      SAB      IAB      Ectopic      Multiple      Live Births  3            Home Medications    Prior to Admission medications   Medication Sig Start Date End Date Taking? Authorizing Provider  doxycycline (VIBRAMYCIN) 100 MG capsule Take 1 capsule (100 mg total) by mouth 2 (two) times daily for 7 days. 11/03/23 11/10/23 Yes Donette Mainwaring, Asberry, PA-C  fluconazole  (DIFLUCAN ) 200 MG tablet Take 1 tablet (200 mg total) by mouth once a week for 4 doses. 11/03/23 11/25/23 Yes Tyesha Joffe, [place]berry, PA-C  ketoconazole (NIZORAL) 2 % shampoo Apply 1 Application topically 2 (two) times a week. 11/05/23  Yes Amand Lemoine, PA-C  ACCU-CHEK GUIDE TEST test strip USE AS  INSTRUCTED. CHECK BLOOD GLUCOSE BY FINGERSTICK TWICE PER DAY. 10/22/23   Newlin, Enobong, MD  Accu-Chek Softclix Lancets lancets Use as instructed. Inject into the skin twice daily 10/11/22   Fleming, Zelda W, NP  chlorthalidone  (HYGROTON ) 25 MG tablet TAKE 1 TABLET (25 MG TOTAL) BY MOUTH DAILY. 07/30/23   Fleming, Zelda W, NP  empagliflozin  (JARDIANCE ) 10 MG TABS tablet Take 1 tablet (10 mg total) by mouth daily before breakfast. 07/18/23   Fleming, Zelda W, NP  ferrous sulfate  325 (65 FE) MG tablet 1 tablet Orally daily    [provider]  gabapentin  (NEURONTIN ) 100 MG capsule TAKE 1 CAPSULE (100 MG TOTAL) BY MOUTH THREE TIMES DAILY. 10/16/23   Fleming, Zelda W, NP  glimepiride  (AMARYL ) 1 MG tablet TAKE 1 TABLET BY MOUTH DAILY WITH BREAKFAST. 09/27/23   Theotis Haze ORN, NP  Lancets Misc. MISC 1 each by Does not apply route in the morning, at noon, and at bedtime. May substitute to any manufacturer covered by patient's insurance. 10/11/22  Theotis Haze ORN, NP  pravastatin  (PRAVACHOL ) 40 MG tablet TAKE 1 TABLET BY MOUTH EVERY DAY FOR CHOLESTEROL 07/30/23   Fleming, Zelda W, NP  tirzepatide  (MOUNJARO ) 5 MG/0.5ML Pen INJECT 5 MG SUBCUTANEOUSLY WEEKLY 09/17/23   Newlin, Enobong, MD    Family History Family History  Problem Relation Age of Onset   Hypertension Mother    Diabetes Mother    Kidney disease Mother    Diabetes Brother    Colon cancer Neg Hx    Colon polyps Neg Hx    Esophageal cancer Neg Hx    Rectal cancer Neg Hx    Stomach cancer Neg Hx     Social History Social History   Tobacco Use   Smoking status: Never    Passive exposure: Never   Smokeless tobacco: Never  Vaping Use   Vaping status: Never Used  Substance Use Topics   Alcohol use: No   Drug use: No     Allergies   Patient has no known allergies.   Review of Systems Review of Systems  As per HPI  Physical Exam Triage Vital Signs ED Triage Vitals  Encounter Vitals Group     BP 11/03/23 1330 116/80      Girls Systolic BP Percentile --      Girls Diastolic BP Percentile --      Boys Systolic BP Percentile --      Boys Diastolic BP Percentile --      Pulse Rate 11/03/23 1330 77     Resp 11/03/23 1330 18     Temp 11/03/23 1330 98.3 F (36.8 C)     Temp Source 11/03/23 1330 Oral     SpO2 11/03/23 1330 97 %     Weight 11/03/23 1328 217 lb 13 oz (98.8 kg)     Height --      Head Circumference --      Peak Flow --      Pain Score 11/03/23 1326 8     Pain Loc --      Pain Education --      Exclude from Growth Chart --    No data found.  Updated Vital Signs BP 116/80 (BP Location: Left Arm)   Pulse 77   Temp 98.3 F (36.8 C) (Oral)   Resp 18   Wt 217 lb 13 oz (98.8 kg)   LMP 11/01/2023   SpO2 97%   BMI 35.16 kg/m   Physical Exam Vitals and nursing note reviewed.  Constitutional:      General: She is not in acute distress.    Appearance: Normal appearance.  HENT:     Head: Hair is abnormal.     Comments: Scalp with multiple raised lesions with some crusting and scaling. Non tender. No erythema or warmth. No hair growth on the majority of parietal area.     Mouth/Throat:     Pharynx: Oropharynx is clear.  Cardiovascular:     Rate and Rhythm: Normal rate and regular rhythm.     Heart sounds: Normal heart sounds.  Pulmonary:     Effort: Pulmonary effort is normal.     Breath sounds: Normal breath sounds.  Lymphadenopathy:     Comments: One small (half cm) lymph node right posterior auricular   Neurological:     Mental Status: She is alert and oriented to person, place, and time.     UC Treatments / Results  Labs (all labs ordered are listed, but only abnormal results are displayed) Labs  Reviewed - No data to display  EKG   Radiology No results found.  Procedures Procedures (including critical care time)  Medications Ordered in UC Medications - No data to display  Initial Impression / Assessment and Plan / UC Course  I have reviewed the triage vital  signs and the nursing notes.  Pertinent labs & imaging results that were available during my care of the patient were reviewed by me and considered in my medical decision making (see chart for details).  Treat for bacterial folliculitis and possible fungal infection Doxycycline BID x 7 days Ketoconazole shampoo twice weekly Fluconazole  weekly x 4 weeks Advised no further product use, no new weave until area has healed. Can return if needed, or follow with PCP. Agrees to plan, no questions  Final Clinical Impressions(s) / UC Diagnoses   Final diagnoses:  Folliculitis  Seborrheic dermatitis, unspecified  Tinea capitis     Discharge Instructions      Doxycycline antibiotic -- Please take as prescribed. Take with food to avoid upset stomach. Finish the full course - you should not have any leftover! This is to cover for possible bacterial infection of the hair follicles (folliculitis)   Ketoconazole shampoo -- twice weekly for a few weeks. This is to treat for possible yeast of the scalp.  I am also prescribing an oral anti-fungal medicine called fluconazole . Take one tablet weekly for 4 weeks in a row (4 doses total). This is also to treat for possible yeast of the scalp.      ED Prescriptions     Medication Sig Dispense Auth. Provider   doxycycline (VIBRAMYCIN) 100 MG capsule Take 1 capsule (100 mg total) by mouth 2 (two) times daily for 7 days. 14 capsule Jacobie Stamey, PA-C   ketoconazole (NIZORAL) 2 % shampoo Apply 1 Application topically 2 (two) times a week. 120 mL Persephonie Hegwood, PA-C   fluconazole  (DIFLUCAN ) 200 MG tablet Take 1 tablet (200 mg total) by mouth once a week for 4 doses. 4 tablet Alexas Basulto, [place]berry, PA-C      PDMP not reviewed this encounter.   Jeryl Asberry, [place] 11/03/23 8588

## 2023-11-03 NOTE — ED Triage Notes (Signed)
 Pt presents c/o allergic reaction x 7 days. Pt states,  I got a weave some a brand I'd never used before and now I have what looks like a chemical burn in the top of my head. I also have some swelling behind my right ear.

## 2023-11-03 NOTE — Discharge Instructions (Addendum)
 Doxycycline antibiotic -- Please take as prescribed. Take with food to avoid upset stomach. Finish the full course - you should not have any leftover! This is to cover for possible bacterial infection of the hair follicles (folliculitis)   Ketoconazole shampoo -- twice weekly for a few weeks. This is to treat for possible yeast of the scalp.  I am also prescribing an oral anti-fungal medicine called fluconazole . Take one tablet weekly for 4 weeks in a row (4 doses total). This is also to treat for possible yeast of the scalp.

## 2023-11-07 ENCOUNTER — Ambulatory Visit: Attending: Nurse Practitioner

## 2023-11-07 DIAGNOSIS — Z111 Encounter for screening for respiratory tuberculosis: Secondary | ICD-10-CM

## 2023-11-07 NOTE — Progress Notes (Signed)
 Tuberculin skin test applied to right ventral forearm. Patient advised that she will need to return by Friday. If she does not she will have to wait until Monday and the test will have to be reapply to be read in 48-72 hour.

## 2023-11-09 ENCOUNTER — Ambulatory Visit: Attending: Nurse Practitioner

## 2023-11-09 LAB — TB SKIN TEST
Induration: 0 mm
TB Skin Test: NEGATIVE

## 2023-11-09 NOTE — Progress Notes (Signed)
PPD Reading Note  PPD read and results entered in Epic[employer].  Result: 0 mm induration.  Interpretation: neg

## 2023-11-19 ENCOUNTER — Encounter: Payer: Self-pay | Admitting: Nurse Practitioner

## 2023-11-19 ENCOUNTER — Ambulatory Visit: Attending: Nurse Practitioner | Admitting: Nurse Practitioner

## 2023-11-19 ENCOUNTER — Other Ambulatory Visit: Payer: Self-pay

## 2023-11-19 VITALS — BP 116/76 | HR 83 | Resp 19 | Ht 66.0 in | Wt 203.0 lb

## 2023-11-19 DIAGNOSIS — Z7984 Long term (current) use of oral hypoglycemic drugs: Secondary | ICD-10-CM

## 2023-11-19 DIAGNOSIS — Z23 Encounter for immunization: Secondary | ICD-10-CM | POA: Diagnosis not present

## 2023-11-19 DIAGNOSIS — E119 Type 2 diabetes mellitus without complications: Secondary | ICD-10-CM

## 2023-11-19 MED ORDER — METFORMIN HCL ER 500 MG PO TB24
500.0000 mg | ORAL_TABLET | Freq: Every day | ORAL | 1 refills | Status: DC
  Filled 2023-11-19: qty 30, 30d supply, fill #0

## 2023-11-19 NOTE — Progress Notes (Unsigned)
 Assessment & Plan:  [name] was seen today for medical management of chronic issues.  Diagnoses and all orders for this visit:  Diabetes mellitus treated with oral medication (HCC) -     Urine Albumin/Creatinine with ratio (send out) [LAB689] -     CMP14+EGFR -     metFORMIN (GLUCOPHAGE-XR) 500 MG 24 hr tablet; Take 1 tablet (500 mg total) by mouth daily with breakfast. -     Hemoglobin A1c Managed with Jardiance . Glimepiride  discontinued due to insurance issues for Ozempic. Metformin XR chosen to minimize gastrointestinal side effects. - Discontinued glimepiride . - Prescribed metformin XR 500 mg daily for 30 days. - Prescribed Ozempic pending metformin trial. - Continue Jardiance  for renal and cardiovascular benefits.   Need for influenza vaccination -     Flu vaccine trivalent PF, 6mos and older(Flulaval,Afluria,Fluarix,Fluzone)  Alopecia and scalp irritation, likely allergic contact dermatitis Symptoms resolved after discontinuing synthetic hair and glue. - Avoid synthetic hair and glue in future hair treatments. - Consider using natural hair products and oils to promote scalp health.  Patient has been counseled on age-appropriate routine health concerns for screening and prevention. These are reviewed and up-to-date. Referrals have been placed accordingly. Immunizations are up-to-date or declined.    Subjective:   Chief Complaint  Patient presents with   Medical Management of Chronic Issues   History of Present Illness Meredith Nichols is a 53 year old female who presents for follow up to HTN   She has a past medical history of Allergy, Anemia, Arthritis, DM 2, GERD, HTN and Heart murmur.   HTN Blood pressure well-controlled with chlorthalidone  25 mg daily BP Readings from Last 3 Encounters:  11/19/23 116/76  11/03/23 116/80  07/18/23 116/76     She experienced significant hair loss and scalp irritation after having her hair braided with synthetic hair, which she  had not used before. Her scalp became swollen, red, and itchy after the braiding process. Initially, she thought the irritation was due to the oils she was using but later reconsidered. The hair loss was significant, with thinning and whitening of her hair, which she associated with a medication she was taking. Since discontinuing the medication, her hair has started to regrow, described as 'peach fuzz'. She also noticed a circular patch of hair loss, which she attributed to stress or tension.  She also mentions having bunions, which may contribute to her foot pain.   Review of Systems  Constitutional:  Negative for fever, malaise/fatigue and weight loss.  HENT: Negative.  Negative for nosebleeds.   Eyes: Negative.  Negative for blurred vision, double vision and photophobia.  Respiratory: Negative.  Negative for cough and shortness of breath.   Cardiovascular: Negative.  Negative for chest pain, palpitations and leg swelling.  Gastrointestinal: Negative.  Negative for heartburn, nausea and vomiting.  Musculoskeletal:  Positive for joint pain. Negative for myalgias.  Skin:        HAIR LOSS   Neurological: Negative.  Negative for dizziness, focal weakness, seizures and headaches.  Psychiatric/Behavioral: Negative.  Negative for suicidal ideas.     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   Outpatient Medications Prior to Visit  Medication Sig Dispense Refill   ACCU-CHEK GUIDE TEST test strip USE AS INSTRUCTED. CHECK BLOOD GLUCOSE BY FINGERSTICK TWICE PER DAY. 100 strip 12   Accu-Chek Softclix Lancets lancets Use as instructed. Inject into the skin twice daily 100 each 3   chlorthalidone  (HYGROTON ) 25 MG tablet TAKE 1 TABLET (25 MG TOTAL) BY MOUTH DAILY. 90 tablet 1   empagliflozin  (JARDIANCE ) 10 MG TABS tablet Take 1 tablet (10 mg total) by mouth daily before breakfast. 90 tablet 1   gabapentin  (NEURONTIN ) 100 MG capsule TAKE 1 CAPSULE (100 MG TOTAL) BY MOUTH THREE TIMES DAILY. 270 capsule 1   ketoconazole (NIZORAL) 2 % shampoo Apply 1 Application topically 2 (two) times a week. 120 mL 0   Lancets Misc. MISC 1 each by Does not apply route in the morning, at noon, and at bedtime. May substitute to any manufacturer covered by patient's insurance. 100 each 6   pravastatin  (PRAVACHOL ) 40 MG tablet TAKE 1 TABLET BY MOUTH EVERY DAY FOR CHOLESTEROL 90 tablet 3   glimepiride  (AMARYL ) 1 MG tablet TAKE 1 TABLET BY MOUTH DAILY WITH BREAKFAST. 90 tablet 1   tirzepatide  (MOUNJARO ) 5 MG/0.5ML Pen INJECT 5 MG SUBCUTANEOUSLY WEEKLY 6 mL 0   ferrous sulfate  325 (65 FE) MG tablet 1 tablet Orally daily     fluconazole  (DIFLUCAN ) 200 MG tablet Take 1 tablet (200 mg total) by mouth once a week for 4 doses. (Patient not taking: Reported on 11/19/2023) 4 tablet 0   No facility-administered medications prior to visit.    No Known Allergies     Objective:    BP 116/76 (BP Location: Left Arm, Patient Position: Sitting, Cuff Size: Normal)   Pulse 83   Resp 19   Ht 5' 6 (1.676 m)   Wt 203 lb (92.1 kg)   LMP 11/01/2023 (Exact Date)   SpO2 100%   BMI 32.77 kg/m  Wt Readings from Last 3 Encounters:  11/19/23 203 lb (92.1 kg)  11/03/23 217 lb 13 oz (98.8 kg)  07/18/23 217 lb 12.8 oz (98.8 kg)    Physical Exam Vitals and nursing  note reviewed.  Constitutional:      Appearance: She is well-developed.  HENT:     Head: Normocephalic and atraumatic.  Cardiovascular:     Rate and Rhythm: Normal rate and regular rhythm.     Heart sounds: Normal heart sounds. No murmur heard.    No friction rub. No gallop.  Pulmonary:     Effort: Pulmonary effort is normal. No tachypnea or respiratory distress.     Breath sounds: Normal breath sounds. No decreased breath sounds, wheezing, rhonchi or rales.  Chest:     Chest wall: No tenderness.  Musculoskeletal:        General: Normal range of motion.     Cervical back: Normal range of motion.  Skin:    General: Skin is  warm and dry.     Comments: Head wrap in place. Hair and scalp not visible  Neurological:     Mental Status: She is alert and oriented to person, place, and time.     Coordination: Coordination normal.  Psychiatric:        Behavior: Behavior normal. Behavior is cooperative.        Thought Content: Thought content normal.        Judgment: Judgment normal.          Patient has been counseled extensively about nutrition and exercise as well as the importance of adherence with medications and regular follow-up. The patient was given clear instructions to go to ER or return to medical center if symptoms don't improve, worsen or new problems develop. The patient verbalized understanding.   Follow-up: Return in about 3 months (around 02/29/2024).   Haze LELON Servant, FNP-BC [facility] [place], [place] [phone]   11/20/2023, 2:13 PM

## 2023-11-20 ENCOUNTER — Other Ambulatory Visit: Payer: Self-pay | Admitting: Nurse Practitioner

## 2023-11-20 ENCOUNTER — Ambulatory Visit: Payer: Self-pay | Admitting: Nurse Practitioner

## 2023-11-20 ENCOUNTER — Encounter: Payer: Self-pay | Admitting: Nurse Practitioner

## 2023-11-20 DIAGNOSIS — E876 Hypokalemia: Secondary | ICD-10-CM

## 2023-11-21 LAB — CMP14+EGFR
ALT: 16 IU/L (ref 0–32)
AST: 18 IU/L (ref 0–40)
Albumin: 4.4 g/dL (ref 3.8–4.9)
Alkaline Phosphatase: 52 IU/L (ref 49–135)
BUN/Creatinine Ratio: 20 (ref 9–23)
BUN: 15 mg/dL (ref 6–24)
Bilirubin Total: 0.5 mg/dL (ref 0.0–1.2)
CO2: 28 mmol/L (ref 20–29)
Calcium: 10.3 mg/dL — ABNORMAL HIGH (ref 8.7–10.2)
Chloride: 100 mmol/L (ref 96–106)
Creatinine, Ser: 0.74 mg/dL (ref 0.57–1.00)
Globulin, Total: 3.1 g/dL (ref 1.5–4.5)
Glucose: 105 mg/dL — ABNORMAL HIGH (ref 70–99)
Potassium: 3.3 mmol/L — ABNORMAL LOW (ref 3.5–5.2)
Sodium: 145 mmol/L — ABNORMAL HIGH (ref 134–144)
Total Protein: 7.5 g/dL (ref 6.0–8.5)
eGFR: 96 mL/min/1.73 (ref >59)

## 2023-11-21 LAB — HEMOGLOBIN A1C
Est. average glucose Bld gHb Est-mCnc: 126 mg/dL
Hgb A1c MFr Bld: 6 % — ABNORMAL HIGH (ref 4.8–5.6)

## 2023-11-21 LAB — MICROALBUMIN / CREATININE URINE RATIO
Creatinine, Urine: 105.2 mg/dL
Microalb/Creat Ratio: 12 mg/g{creat} (ref 0–29)
Microalbumin, Urine: 12.4 ug/mL

## 2023-11-22 ENCOUNTER — Other Ambulatory Visit: Payer: Self-pay

## 2023-12-04 ENCOUNTER — Telehealth: Payer: Self-pay

## 2023-12-04 NOTE — Telephone Encounter (Signed)
 Patient came in the office today and picked up the staff health assessment medical report paperwork.

## 2023-12-04 NOTE — Telephone Encounter (Signed)
 Called patient unable to make contact, voicemail has been left about paperwork being done and at the front for pick up

## 2023-12-04 NOTE — Telephone Encounter (Signed)
 Noted

## 2023-12-29 ENCOUNTER — Other Ambulatory Visit: Payer: Self-pay | Admitting: Nurse Practitioner

## 2023-12-29 ENCOUNTER — Encounter: Admitting: Nurse Practitioner

## 2023-12-29 DIAGNOSIS — K089 Disorder of teeth and supporting structures, unspecified: Secondary | ICD-10-CM

## 2023-12-29 MED ORDER — PENICILLIN V POTASSIUM 500 MG PO TABS: 500.0000 mg | ORAL_TABLET | Freq: Three times a day (TID) | ORAL | 0 refills | Status: AC

## 2023-12-29 MED ORDER — IBUPROFEN 600 MG PO TABS: 600.0000 mg | ORAL_TABLET | Freq: Three times a day (TID) | ORAL | 0 refills | Status: AC | PRN

## 2023-12-29 NOTE — Progress Notes (Signed)
 Ms ramsuer has already been sent abx and motrin  from earlier today

## 2023-12-29 NOTE — Progress Notes (Signed)
 Abx sent

## 2024-01-08 ENCOUNTER — Other Ambulatory Visit: Payer: Self-pay | Admitting: Nurse Practitioner

## 2024-01-08 ENCOUNTER — Other Ambulatory Visit: Payer: Self-pay

## 2024-01-08 ENCOUNTER — Encounter: Payer: Self-pay | Admitting: Nurse Practitioner

## 2024-01-08 DIAGNOSIS — E119 Type 2 diabetes mellitus without complications: Secondary | ICD-10-CM

## 2024-01-08 MED ORDER — OZEMPIC (0.25 OR 0.5 MG/DOSE) 2 MG/3ML ~~LOC~~ SOPN
0.2500 mg | PEN_INJECTOR | SUBCUTANEOUS | 1 refills | Status: AC
  Filled 2024-01-08: qty 3, 28d supply, fill #0
  Filled 2024-02-14: qty 3, 28d supply, fill #1

## 2024-01-15 ENCOUNTER — Other Ambulatory Visit: Payer: Self-pay | Admitting: Nurse Practitioner

## 2024-01-15 DIAGNOSIS — K089 Disorder of teeth and supporting structures, unspecified: Secondary | ICD-10-CM

## 2024-01-15 MED ORDER — CHLORHEXIDINE GLUCONATE 0.12 % MT SOLN: 15.0000 mL | Freq: Two times a day (BID) | OROMUCOSAL | 0 refills | Status: AC

## 2024-01-15 MED ORDER — CLINDAMYCIN HCL 300 MG PO CAPS: 300.0000 mg | ORAL_CAPSULE | Freq: Three times a day (TID) | ORAL | 0 refills | Status: AC

## 2024-01-19 IMAGING — DX DG KNEE COMPLETE 4+V*L*
4 series · 4 of 4 positions shown · non-contrast
Comparison: None.

CLINICAL DATA: Left knee pain.

EXAM:
LEFT KNEE - COMPLETE 4+ VIEW

[knee ap]
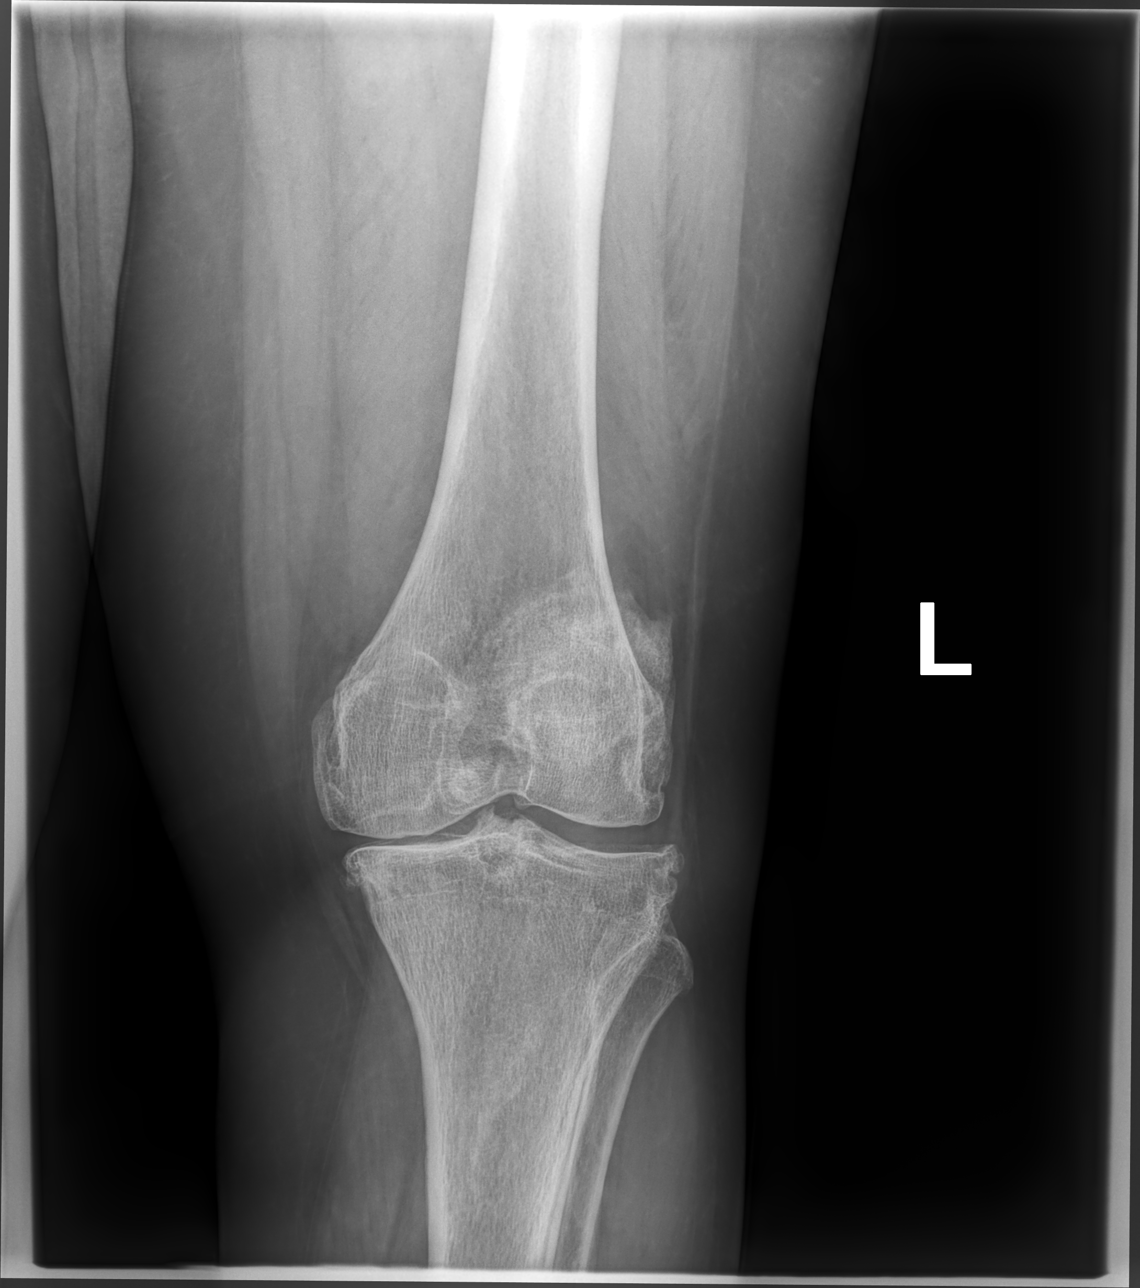

[knee lmo]
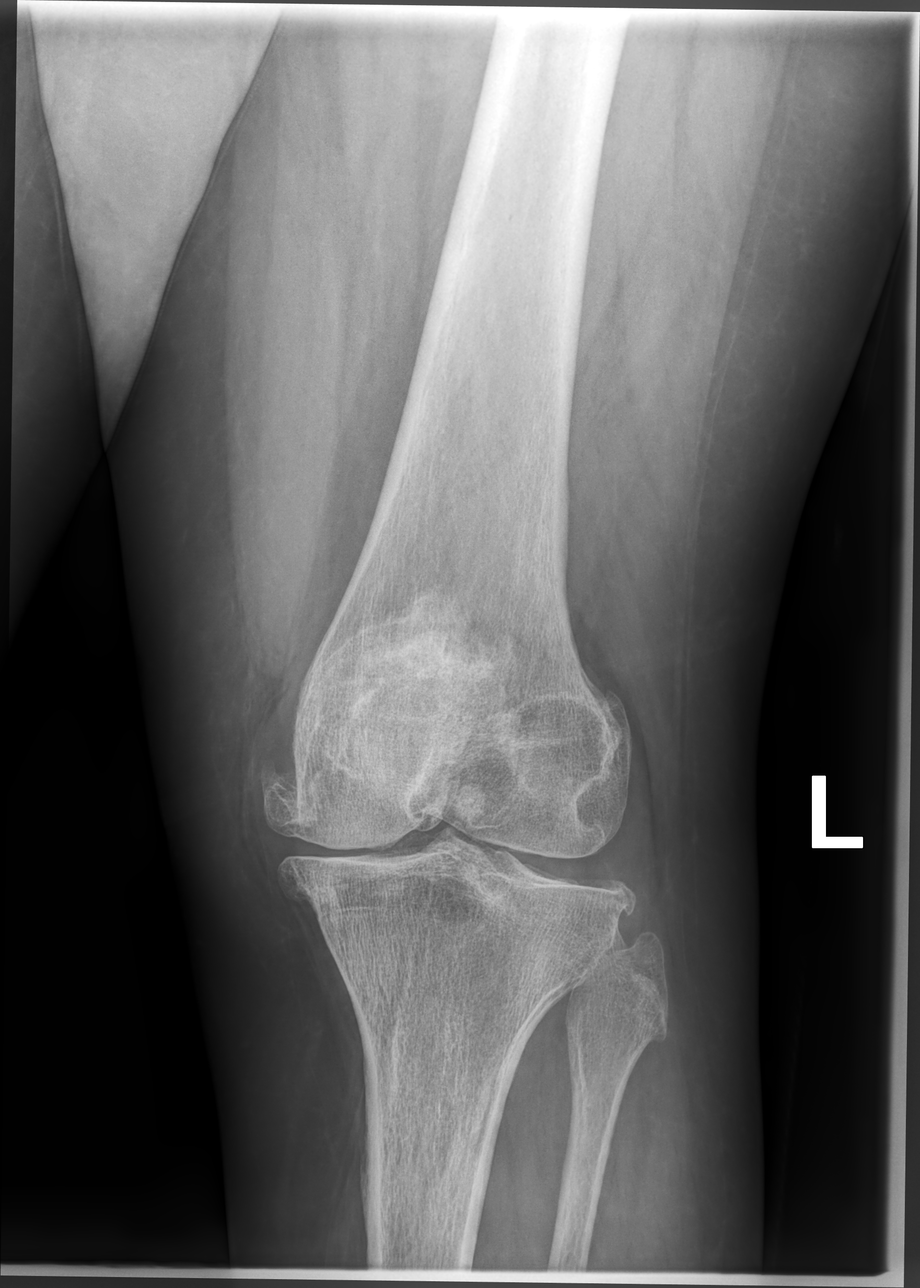

[knee mlo]
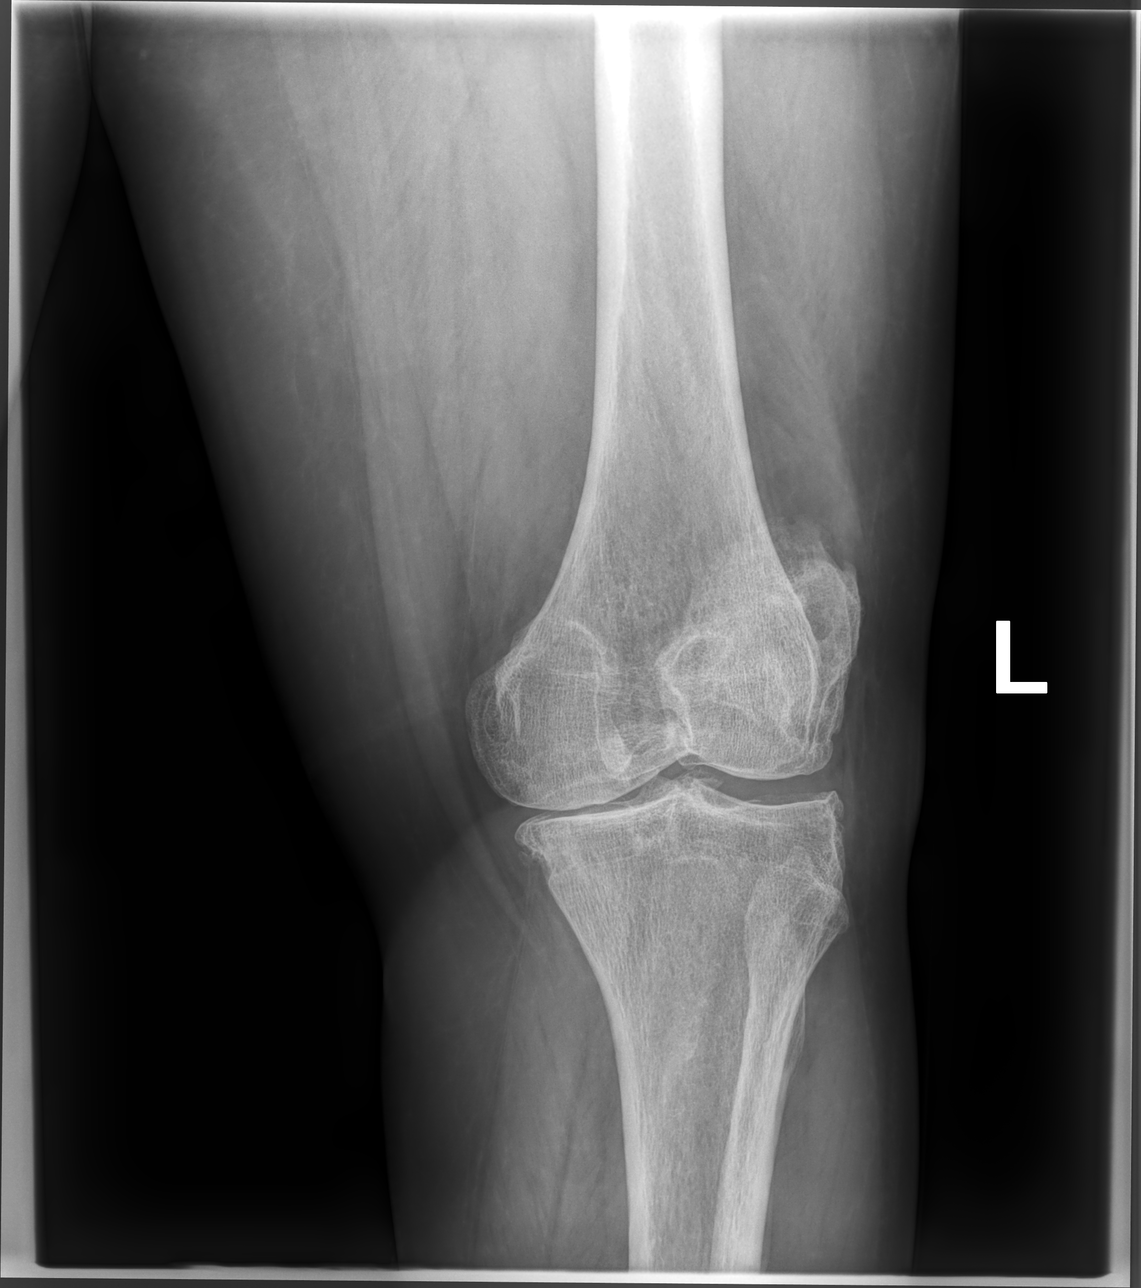

[knee lat]
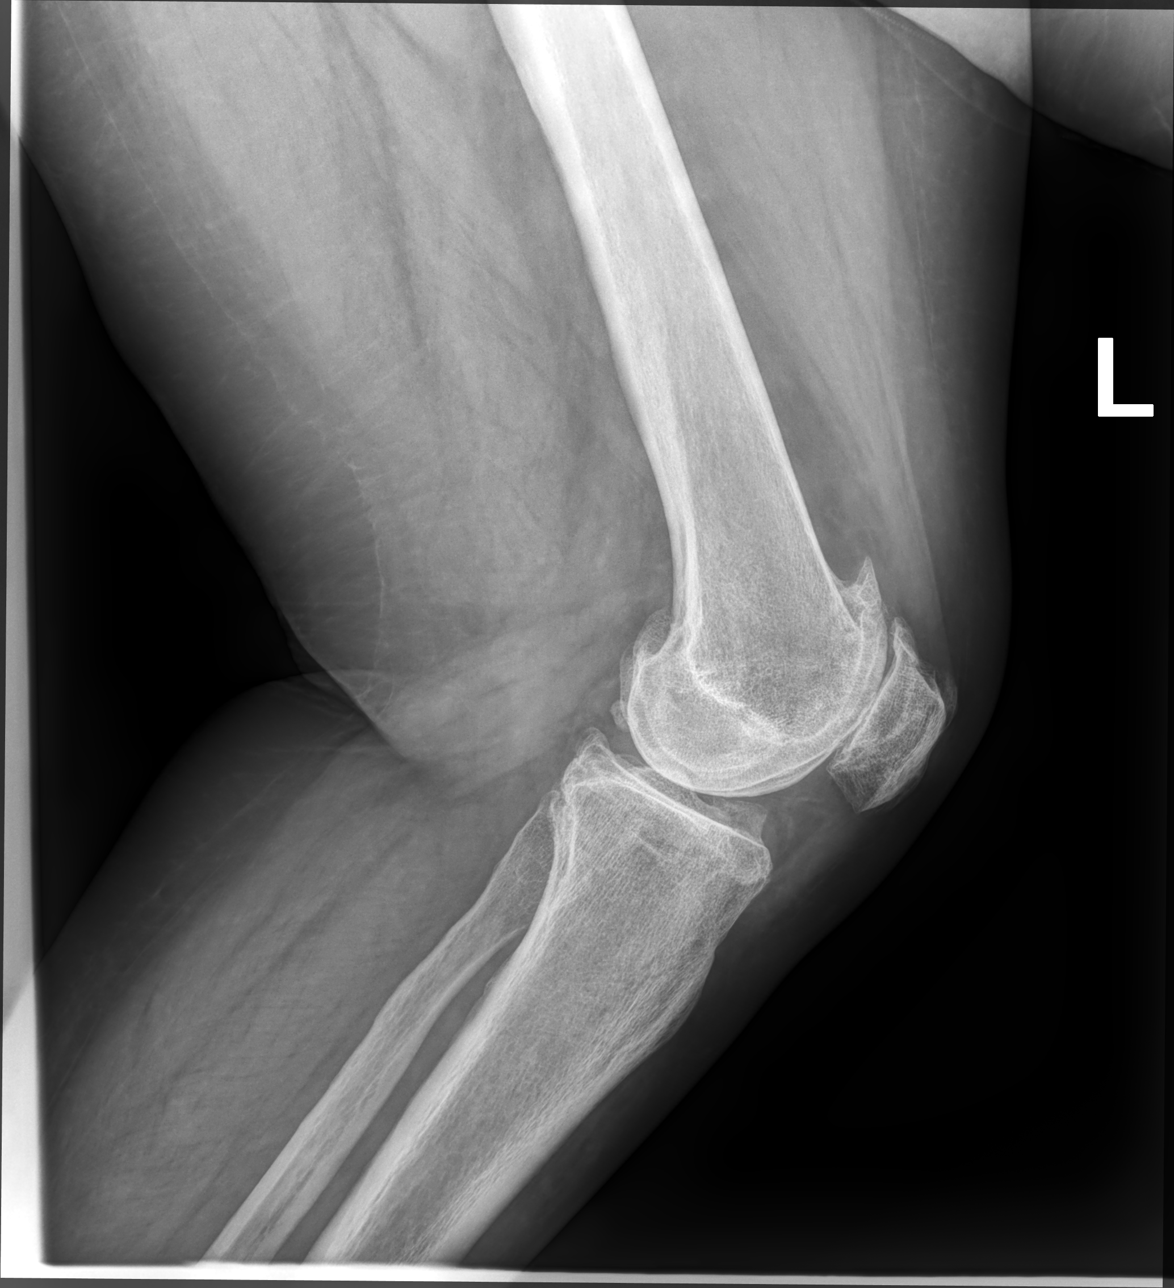

[4 of 4 positions shown; findings below may reference images not displayed]

FINDINGS: Significant age advanced tricompartmental degenerative changes with
joint space narrowing, osteophytic spurring and subchondral cystic
change. No acute bony findings or osteochondral lesion. No joint
effusion.
IMPRESSION: Significant age advanced tricompartmental degenerative changes.

## 2024-02-14 ENCOUNTER — Other Ambulatory Visit: Payer: Self-pay

## 2024-03-03 ENCOUNTER — Ambulatory Visit: Admitting: Nurse Practitioner
